# Patient Record
Sex: Female | Born: 1965 | Race: White | Hispanic: No | Marital: Married | Smoking: Never smoker
Health system: Southern US, Community
[De-identification: ages and names within clinical notes are randomized; demographics above are authoritative.]

## PROBLEM LIST (undated history)

## (undated) DIAGNOSIS — U071 COVID-19: Secondary | ICD-10-CM

## (undated) DIAGNOSIS — J111 Influenza due to unidentified influenza virus with other respiratory manifestations: Secondary | ICD-10-CM

## (undated) DIAGNOSIS — J329 Chronic sinusitis, unspecified: Secondary | ICD-10-CM

## (undated) DIAGNOSIS — N200 Calculus of kidney: Secondary | ICD-10-CM

## (undated) DIAGNOSIS — L80 Vitiligo: Secondary | ICD-10-CM

## (undated) DIAGNOSIS — Z9889 Other specified postprocedural states: Secondary | ICD-10-CM

## (undated) DIAGNOSIS — R112 Nausea with vomiting, unspecified: Secondary | ICD-10-CM

## (undated) HISTORY — PX: BREAST REDUCTION SURGERY: SHX8

## (undated) HISTORY — PX: ABDOMINAL HYSTERECTOMY: SHX81

## (undated) HISTORY — DX: Chronic sinusitis, unspecified: J32.9

## (undated) HISTORY — PX: CHOLECYSTECTOMY: SHX55

## (undated) HISTORY — DX: Vitiligo: L80

## (undated) HISTORY — DX: Calculus of kidney: N20.0

## (undated) HISTORY — DX: Influenza due to unidentified influenza virus with other respiratory manifestations: J11.1

## (undated) HISTORY — DX: COVID-19: U07.1

---

## 1999-09-30 HISTORY — PX: REDUCTION MAMMAPLASTY: SUR839

## 2006-01-07 ENCOUNTER — Emergency Department: Payer: Self-pay | Admitting: Emergency Medicine

## 2016-11-07 LAB — HM COLONOSCOPY

## 2018-07-01 ENCOUNTER — Telehealth: Payer: Self-pay

## 2018-07-01 NOTE — Telephone Encounter (Signed)
Copied from CRM [id number]. Topic: Appointment Scheduling - New Patient >> Jul 01, 2018  2:55 PM Lorayne Bender wrote: New patient has been scheduled for your office. Provider: Dr. Judie Grieve Date of Appointment: 08/13/18  Route to department's PEC pool.

## 2018-07-05 NOTE — Telephone Encounter (Signed)
Np paperwork was mailed out.

## 2018-08-13 ENCOUNTER — Telehealth: Payer: Self-pay | Admitting: Internal Medicine

## 2018-08-13 ENCOUNTER — Ambulatory Visit (INDEPENDENT_AMBULATORY_CARE_PROVIDER_SITE_OTHER): Payer: Self-pay | Admitting: Internal Medicine

## 2018-08-13 VITALS — BP 130/90 | HR 72 | Temp 97.8°F | Ht 65.0 in | Wt 199.8 lb

## 2018-08-13 DIAGNOSIS — Z1389 Encounter for screening for other disorder: Secondary | ICD-10-CM

## 2018-08-13 DIAGNOSIS — L739 Follicular disorder, unspecified: Secondary | ICD-10-CM

## 2018-08-13 DIAGNOSIS — E559 Vitamin D deficiency, unspecified: Secondary | ICD-10-CM

## 2018-08-13 DIAGNOSIS — Z1159 Encounter for screening for other viral diseases: Secondary | ICD-10-CM

## 2018-08-13 DIAGNOSIS — L80 Vitiligo: Secondary | ICD-10-CM

## 2018-08-13 DIAGNOSIS — L989 Disorder of the skin and subcutaneous tissue, unspecified: Secondary | ICD-10-CM

## 2018-08-13 DIAGNOSIS — Z0184 Encounter for antibody response examination: Secondary | ICD-10-CM

## 2018-08-13 DIAGNOSIS — Z1231 Encounter for screening mammogram for malignant neoplasm of breast: Secondary | ICD-10-CM

## 2018-08-13 DIAGNOSIS — Z0000 Encounter for general adult medical examination without abnormal findings: Secondary | ICD-10-CM

## 2018-08-13 DIAGNOSIS — Z13220 Encounter for screening for lipoid disorders: Secondary | ICD-10-CM

## 2018-08-13 DIAGNOSIS — Z1329 Encounter for screening for other suspected endocrine disorder: Secondary | ICD-10-CM

## 2018-08-13 HISTORY — DX: Disorder of the skin and subcutaneous tissue, unspecified: L98.9

## 2018-08-13 MED ORDER — MUPIROCIN 2 % EX OINT: 1.0000 "application " | TOPICAL_OINTMENT | Freq: Two times a day (BID) | CUTANEOUS | 0 refills | Status: DC

## 2018-08-13 MED ORDER — CLOTRIMAZOLE 1 % EX CREA: 1.0000 "application " | TOPICAL_CREAM | Freq: Two times a day (BID) | CUTANEOUS | 0 refills | Status: DC

## 2018-08-13 NOTE — Progress Notes (Addendum)
Chief Complaint  Patient presents with  . Establish Care   New patient needs new primary doctor from [place] [facility]  1. Right lower abdomen c/o wound which was oozing pus now resolved and just red nothing tried  2. Vitiligo arms and legs with FH mom and mGM   Review of Systems  Constitutional: Negative for weight loss.  HENT: Negative for hearing loss.   Eyes: Negative for blurred vision.  Respiratory: Negative for shortness of breath.   Cardiovascular: Negative for chest pain.  Gastrointestinal: Negative for abdominal pain.  Musculoskeletal: Negative for falls.  Skin:       Skin lesion abdomen   Neurological: Negative for headaches.  Psychiatric/Behavioral: Negative for depression. The patient is not nervous/anxious.    Past Medical History:  Diagnosis Date  . Vitiligo    arms, legs FH vitiligo   Past Surgical History:  Procedure Laterality Date  . ABDOMINAL HYSTERECTOMY     for endometriosis no h/o abnormal total ovaries uterus,cervix out   . BREAST REDUCTION SURGERY     2001  . CESAREAN SECTION     2000  . CHOLECYSTECTOMY     Family History  Problem Relation Age of Onset  . Heart disease Mother   . Thyroid disease Mother   . Heart disease Father   . Hearing loss Father   . Stroke Father   . Birth defects Maternal Grandfather   . Heart disease Maternal Grandfather   . Stroke Maternal Grandfather   . Birth defects Paternal Grandmother   . Heart disease Paternal Grandfather    Social History   Socioeconomic History  . Marital status: Married    Spouse name: Not on file  . Number of children: Not on file  . Years of education: Not on file  . Highest education level: Not on file  Occupational History  . Not on file  Social Needs  . Financial resource strain: Not on file  . Food insecurity:    Worry: Not on file    Inability: Not on file  . Transportation needs:    Medical: Not on file    Non-medical: Not on file  Tobacco Use  . Smoking status: Never Smoker   . Smokeless tobacco: Never Used  Substance and Sexual Activity  . Alcohol use: Not on file  . Drug use: Not on file  . Sexual activity: Not on file  Lifestyle  . Physical activity:    Days per week: Not on file    Minutes per session: Not on file  . Stress: Not on file  Relationships  . Social connections:    Talks on phone: Not on file    Gets together: Not on file    Attends religious service: Not on file    Active member of club or organization: Not on file    Attends meetings of clubs or organizations: Not on file    Relationship status: Not on file  . Intimate partner violence:    Fear of current or ex partner: Not on file    Emotionally abused: Not on file    Physically abused: Not on file    Forced sexual activity: Not on file  Other Topics Concern  . Not on file  Social History Narrative   2 kids son and daughter    2nd grade teacher    Married    Owns guns, wears seat belt, safe in relationship   Current Meds  Medication Sig  . Misc Natural Products (  OSTEO BI-FLEX ADV TRIPLE ST) TABS Take by mouth daily.  Marland Kitchen. UNABLE TO FIND daily. Med Name: plexus probiotic  . VITAMIN D PO Take 2,000 Units by mouth daily.   No Known Allergies No results found for this or any previous visit (from the past 2160 hour(s)). Objective  Body mass index is 33.25 kg/m. Wt Readings from Last 3 Encounters:  08/13/18 199 lb 12.8 oz (90.6 kg)   Temp Readings from Last 3 Encounters:  08/13/18 97.8 F (36.6 C) (Oral)   BP Readings from Last 3 Encounters:  08/13/18 130/90   Pulse Readings from Last 3 Encounters:  08/13/18 72    Physical Exam  Constitutional: She is oriented to person, place, and time. Vital signs are normal. She appears well-developed and well-nourished. She is cooperative.  HENT:  Head: Normocephalic and atraumatic.  Mouth/Throat: Oropharynx is clear and moist and mucous membranes are normal.  Eyes: Pupils are equal, round, and reactive to light. Conjunctivae  are normal.  Cardiovascular: Normal rate, regular rhythm and normal heart sounds.  Pulmonary/Chest: Effort normal and breath sounds normal.  Neurological: She is alert and oriented to person, place, and time. Gait normal.  Skin: Skin is warm, dry and intact.     Psychiatric: She has a normal mood and affect. Her speech is normal and behavior is normal. Judgment and thought content normal. Cognition and memory are normal.  Nursing note and vitals reviewed.   Assessment   1. Right lower abdomen ? Resolving abscess vs tinea vs folliculitis 2. Vitiligo arms and legs with FH  3. HM Plan  1. Lotriman, bactroban and HC 2. Monitor for now  3.  sch fasting labs   Declines flu shot for now may get in future  Tdap vx had fast med [place] need to check had 3-4 years ago  sch fasting labs  [facility] [place] need to get records  LMP 2004 s/p hysterectomy endometriosis no h/o abnormal pap total ovaries out prev. On estradiol -no need further paps  Mammogram 09/2017 need to get records [facility] -referred today  Provider: Dr. French Anaracy [facility]

## 2018-08-13 NOTE — Progress Notes (Signed)
Pre visit review using our clinic review tool, if applicable. No additional management support is needed unless otherwise documented below in the visit note. 

## 2018-08-13 NOTE — Telephone Encounter (Signed)
FYI, Pt states at check out she needs to speak to the assistant principal to check dates before she schedules lab and follow up.

## 2018-08-13 NOTE — Patient Instructions (Addendum)
cetaphil or cerave cream  Hydrocortisone 2x per day 1% You have vitiligo on your arms read about this on [facility] or web MD    Folliculitis Folliculitis is inflammation of the hair follicles. Folliculitis most commonly occurs on the scalp, thighs, legs, back, and buttocks. However, it can occur anywhere on the body. What are the causes? This condition may be caused by:  A bacterial infection (common).  A fungal infection.  A viral infection.  Coming into contact with certain chemicals, especially oils and tars.  Shaving or waxing.  Applying greasy ointments or creams to your skin often.  Long-lasting folliculitis and folliculitis that keeps coming back can be caused by bacteria that live in the nostrils. What increases the risk? This condition is more likely to develop in people with:  A weakened immune system.  Diabetes.  Obesity.  What are the signs or symptoms? Symptoms of this condition include:  Redness.  Soreness.  Swelling.  Itching.  Small white or yellow, pus-filled, itchy spots (pustules) that appear over a reddened area. If there is an infection that goes deep into the follicle, these may develop into a boil (furuncle).  A group of closely packed boils (carbuncle). These tend to form in hairy, sweaty areas of the body.  How is this diagnosed? This condition is diagnosed with a skin exam. To find what is causing the condition, your health care provider may take a sample of one of the pustules or boils for testing. How is this treated? This condition may be treated by:  Applying warm compresses to the affected areas.  Taking an antibiotic medicine or applying an antibiotic medicine to the skin.  Applying or bathing with an antiseptic solution.  Taking an over-the-counter medicine to help with itching.  Having a procedure to drain any pustules or boils. This may be done if a pustule or boil contains a lot of pus or fluid.  Laser hair removal.  This may be done to treat long-lasting folliculitis.  Follow these instructions at home:  If directed, apply heat to the affected area as often as told by your health care provider. Use the heat source that your health care provider recommends, such as a moist heat pack or a heating pad. ? Place a towel between your skin and the heat source. ? Leave the heat on for 20-30 minutes. ? Remove the heat if your skin turns bright red. This is especially important if you are unable to feel pain, heat, or cold. You may have a greater risk of getting burned.  If you were prescribed an antibiotic medicine, use it as told by your health care provider. Do not stop using the antibiotic even if you start to feel better.  Take over-the-counter and prescription medicines only as told by your health care provider.  Do not shave irritated skin.  Keep all follow-up visits as told by your health care provider. This is important. Get help right away if:  You have more redness, swelling, or pain in the affected area.  Red streaks are spreading from the affected area.  You have a fever. This information is not intended to replace advice given to you by your health care provider. Make sure you discuss any questions you have with your health care provider. Document Released: 11/24/2001 Document Revised: 04/04/2016 Document Reviewed: 07/06/2015 Elsevier Interactive Patient Education  2018 [employer]

## 2018-08-16 NOTE — Telephone Encounter (Signed)
Patient's lab work is scheduled for August 24, 2018 because she will be off. Is that ok?  Does Dr. French Anaracy want her to come in sooner?  She can come in Friday August 20, 2018, but she will miss some work. Please advise.

## 2018-08-16 NOTE — Telephone Encounter (Signed)
I don't see where any labs have even been ordered for this patient. Please advise?

## 2018-08-17 ENCOUNTER — Encounter: Payer: Self-pay | Admitting: Internal Medicine

## 2018-08-17 NOTE — Telephone Encounter (Signed)
Call to sch fasting labs orders in   TMS

## 2018-08-17 NOTE — Progress Notes (Signed)
TDAP not in [facility]

## 2018-08-25 ENCOUNTER — Telehealth: Payer: Self-pay | Admitting: Internal Medicine

## 2018-08-25 ENCOUNTER — Encounter (INDEPENDENT_AMBULATORY_CARE_PROVIDER_SITE_OTHER): Payer: Self-pay

## 2018-08-25 ENCOUNTER — Other Ambulatory Visit (INDEPENDENT_AMBULATORY_CARE_PROVIDER_SITE_OTHER): Payer: Self-pay

## 2018-08-25 DIAGNOSIS — E559 Vitamin D deficiency, unspecified: Secondary | ICD-10-CM

## 2018-08-25 DIAGNOSIS — Z1329 Encounter for screening for other suspected endocrine disorder: Secondary | ICD-10-CM

## 2018-08-25 DIAGNOSIS — Z13220 Encounter for screening for lipoid disorders: Secondary | ICD-10-CM

## 2018-08-25 DIAGNOSIS — Z0184 Encounter for antibody response examination: Secondary | ICD-10-CM

## 2018-08-25 DIAGNOSIS — Z0000 Encounter for general adult medical examination without abnormal findings: Secondary | ICD-10-CM

## 2018-08-25 DIAGNOSIS — Z1159 Encounter for screening for other viral diseases: Secondary | ICD-10-CM

## 2018-08-25 NOTE — Telephone Encounter (Signed)
FYI-Pt shot record is in Dr Shirlee LatchMclean folder up front. Pt wanted her shots to be documented. Thank you!

## 2018-08-25 NOTE — Addendum Note (Signed)
Addended by: Penne LashWIGGINS, Satoya Feeley N on: 08/25/2018 09:31 AM   Modules accepted: Orders

## 2018-08-25 NOTE — Telephone Encounter (Signed)
Shots have been added to chart 

## 2018-08-27 LAB — COMPREHENSIVE METABOLIC PANEL
AG Ratio: 1.3 (calc) (ref 1.0–2.5)
ALKALINE PHOSPHATASE (APISO): 106 U/L (ref 33–130)
ALT: 12 U/L (ref 6–29)
AST: 19 U/L (ref 10–35)
Albumin: 4.3 g/dL (ref 3.6–5.1)
BILIRUBIN TOTAL: 0.6 mg/dL (ref 0.2–1.2)
BUN: 16 mg/dL (ref 7–25)
CO2: 24 mmol/L (ref 20–32)
CREATININE: 0.82 mg/dL (ref 0.50–1.05)
Calcium: 9.5 mg/dL (ref 8.6–10.4)
Chloride: 102 mmol/L (ref 98–110)
Globulin: 3.4 g/dL (calc) (ref 1.9–3.7)
Glucose, Bld: 93 mg/dL (ref 65–99)
Potassium: 4.9 mmol/L (ref 3.5–5.3)
Sodium: 138 mmol/L (ref 135–146)
Total Protein: 7.7 g/dL (ref 6.1–8.1)

## 2018-08-27 LAB — HEPATITIS B SURFACE ANTIBODY, QUANTITATIVE: Hepatitis B-Post: 843 m[IU]/mL (ref >=10)

## 2018-08-27 LAB — VITAMIN D 25 HYDROXY (VIT D DEFICIENCY, FRACTURES): Vit D, 25-Hydroxy: 38 ng/mL (ref 30–100)

## 2018-08-27 LAB — T4, FREE: Free T4: 1.2 ng/dL (ref 0.8–1.8)

## 2018-08-27 LAB — CBC WITH DIFFERENTIAL/PLATELET
Basophils Absolute: 48 cells/uL (ref 0–200)
Basophils Relative: 0.7 %
EOS PCT: 3.9 %
Eosinophils Absolute: 269 cells/uL (ref 15–500)
HCT: 41.7 % (ref 35.0–45.0)
Hemoglobin: 13.7 g/dL (ref 11.7–15.5)
Lymphs Abs: 1449 cells/uL (ref 850–3900)
MCH: 29.7 pg (ref 27.0–33.0)
MCHC: 32.9 g/dL (ref 32.0–36.0)
MCV: 90.3 fL (ref 80.0–100.0)
MPV: 11.6 fL (ref 7.5–12.5)
Monocytes Relative: 6.9 %
NEUTROS PCT: 67.5 %
Neutro Abs: 4658 cells/uL (ref 1500–7800)
Platelets: 270 10*3/uL (ref 140–400)
RBC: 4.62 10*6/uL (ref 3.80–5.10)
RDW: 12.4 % (ref 11.0–15.0)
Total Lymphocyte: 21 %
WBC: 6.9 10*3/uL (ref 3.8–10.8)
WBCMIX: 476 {cells}/uL (ref 200–950)

## 2018-08-27 LAB — LIPID PANEL
CHOLESTEROL: 171 mg/dL (ref <200)
HDL: 71 mg/dL (ref >50)
LDL CHOLESTEROL (CALC): 84 mg/dL
Non-HDL Cholesterol (Calc): 100 mg/dL (calc) (ref <130)
TRIGLYCERIDES: 73 mg/dL (ref <150)
Total CHOL/HDL Ratio: 2.4 (calc) (ref <5.0)

## 2018-08-27 LAB — MEASLES/MUMPS/RUBELLA IMMUNITY
Mumps IgG: 135 AU/mL
RUBELLA: 25.2 {index}
Rubeola IgG: 72.9 AU/mL

## 2018-08-27 LAB — TSH: TSH: 2.07 mIU/L

## 2018-10-21 ENCOUNTER — Ambulatory Visit
Admission: RE | Admit: 2018-10-21 | Discharge: 2018-10-21 | Disposition: A | Discharge status: Home / Self Care | Payer: BC Managed Care – PPO | Source: Ambulatory Visit | Attending: Internal Medicine | Admitting: Internal Medicine

## 2018-10-21 DIAGNOSIS — Z1231 Encounter for screening mammogram for malignant neoplasm of breast: Secondary | ICD-10-CM | POA: Diagnosis present

## 2018-11-08 ENCOUNTER — Ambulatory Visit: Payer: Self-pay

## 2018-11-08 NOTE — Telephone Encounter (Signed)
Patient called and she says that her son came home from college this past weekend and was not feeling well. She says she told him to go to the clinic at the school today if he was worse. He went and was diagnosed with Flu B. She says that she has a runny nose, feeling tired, sinus drainage to the back of her throat which is causing it to be sore. She's asking for Tamiflu to prevent her from getting the flu. I advised I will send this request to Dr. Judie Grieve and someone from the office will call tomorrow. Her CB # [id number]. Numbers updated in chart.  Reason for Disposition . [1] Influenza EXPOSURE within last 48 hours (2 days) AND [2] NOT HIGH RISK AND [3] strongly requests antiviral medication  Answer Assessment - Initial Assessment Questions 1. TYPE of EXPOSURE: "How were you exposed?" (e.g., close contact, not a close contact)     Son who came home from college in [place] this past weekend 2. DATE of EXPOSURE: "When did the exposure occur?" (e.g., hour, days, weeks)     This past weekend; diagnosed this evening with Flu B positive 3. PREGNANCY: "Is there any chance you are pregnant?" "When was your last menstrual period?"     No 4. HIGH RISK for COMPLICATIONS: "Do you have any heart or lung problems? Do you have a weakened immune system?" (e.g., CHF, COPD, asthma, HIV positive, chemotherapy, renal failure, diabetes mellitus, sickle cell anemia)     No 5. SYMPTOMS: "Do you have any symptoms?" (e.g., cough, fever, sore throat, difficulty breathing).     Tired, sore throat, sinus drainage  Protocols used: INFLUENZA EXPOSURE-A-AH

## 2018-11-09 ENCOUNTER — Other Ambulatory Visit: Payer: Self-pay | Admitting: Internal Medicine

## 2018-11-09 ENCOUNTER — Encounter: Payer: Self-pay | Admitting: Internal Medicine

## 2018-11-09 DIAGNOSIS — Z20828 Contact with and (suspected) exposure to other viral communicable diseases: Secondary | ICD-10-CM

## 2018-11-09 MED ORDER — OSELTAMIVIR PHOSPHATE 75 MG PO CAPS: 75.0000 mg | ORAL_CAPSULE | Freq: Two times a day (BID) | ORAL | 0 refills | Status: DC

## 2018-11-09 NOTE — Telephone Encounter (Signed)
Sent tamiflu son had flu B and she was around him rec mucinex dm, robitussin dm tamiflu sent 75 bid x 5 days  Sore throat and pnd, cough not in chest, no fever  If worse rec appt   TMS

## 2018-11-09 NOTE — Telephone Encounter (Signed)
Fax note to work   [employer]

## 2018-11-10 NOTE — Telephone Encounter (Signed)
Note has been faxed.

## 2018-11-25 ENCOUNTER — Ambulatory Visit: Payer: BC Managed Care – PPO | Admitting: Internal Medicine

## 2018-11-25 ENCOUNTER — Encounter: Payer: Self-pay | Admitting: Internal Medicine

## 2018-11-25 VITALS — BP 132/80 | HR 72 | Temp 97.8°F | Ht 65.0 in | Wt 205.4 lb

## 2018-11-25 DIAGNOSIS — T753XXA Motion sickness, initial encounter: Secondary | ICD-10-CM

## 2018-11-25 DIAGNOSIS — R109 Unspecified abdominal pain: Secondary | ICD-10-CM

## 2018-11-25 DIAGNOSIS — R300 Dysuria: Secondary | ICD-10-CM

## 2018-11-25 DIAGNOSIS — R1032 Left lower quadrant pain: Secondary | ICD-10-CM

## 2018-11-25 MED ORDER — CIPROFLOXACIN HCL 500 MG PO TABS: 500.0000 mg | ORAL_TABLET | Freq: Two times a day (BID) | ORAL | 0 refills | Status: DC

## 2018-11-25 MED ORDER — SCOPOLAMINE 1 MG/3DAYS TD PT72: 1.0000 | MEDICATED_PATCH | TRANSDERMAL | 0 refills | Status: DC

## 2018-11-25 NOTE — Progress Notes (Signed)
Pre visit review using our clinic review tool, if applicable. No additional management support is needed unless otherwise documented below in the visit note. 

## 2018-11-25 NOTE — Patient Instructions (Signed)
My chart me or call me next week to let me know how doing !!! Consider CT ab/pelvis to look   Urinary Tract Infection, Adult  A urinary tract infection (UTI) is an infection of any part of the urinary tract. The urinary tract includes the kidneys, ureters, bladder, and urethra. These organs make, store, and get rid of urine in the body. Your health care provider may use other names to describe the infection. An upper UTI affects the ureters and kidneys (pyelonephritis). A lower UTI affects the bladder (cystitis) and urethra (urethritis). What are the causes? Most urinary tract infections are caused by bacteria in your genital area, around the entrance to your urinary tract (urethra). These bacteria grow and cause inflammation of your urinary tract. What increases the risk? You are more likely to develop this condition if:  You have a urinary catheter that stays in place (indwelling).  You are not able to control when you urinate or have a bowel movement (you have incontinence).  You are female and you: ? Use a spermicide or diaphragm for birth control. ? Have low estrogen levels. ? Are pregnant.  You have certain genes that increase your risk (genetics).  You are sexually active.  You take antibiotic medicines.  You have a condition that causes your flow of urine to slow down, such as: ? An enlarged prostate, if you are female. ? Blockage in your urethra (stricture). ? A kidney stone. ? A nerve condition that affects your bladder control (neurogenic bladder). ? Not getting enough to drink, or not urinating often.  You have certain medical conditions, such as: ? Diabetes. ? A weak disease-fighting system (immunesystem). ? Sickle cell disease. ? Gout. ? Spinal cord injury. What are the signs or symptoms? Symptoms of this condition include:  Needing to urinate right away (urgently).  Frequent urination or passing small amounts of urine frequently.  Pain or burning with  urination.  Blood in the urine.  Urine that smells bad or unusual.  Trouble urinating.  Cloudy urine.  Vaginal discharge, if you are female.  Pain in the abdomen or the lower back. You may also have:  Vomiting or a decreased appetite.  Confusion.  Irritability or tiredness.  A fever.  Diarrhea. The first symptom in older adults may be confusion. In some cases, they may not have any symptoms until the infection has worsened. How is this diagnosed? This condition is diagnosed based on your medical history and a physical exam. You may also have other tests, including:  Urine tests.  Blood tests.  Tests for sexually transmitted infections (STIs). If you have had more than one UTI, a cystoscopy or imaging studies may be done to determine the cause of the infections. How is this treated? Treatment for this condition includes:  Antibiotic medicine.  Over-the-counter medicines to treat discomfort.  Drinking enough water to stay hydrated. If you have frequent infections or have other conditions such as a kidney stone, you may need to see a health care provider who specializes in the urinary tract (urologist). In rare cases, urinary tract infections can cause sepsis. Sepsis is a life-threatening condition that occurs when the body responds to an infection. Sepsis is treated in the hospital with IV antibiotics, fluids, and other medicines. Follow these instructions at home:  Medicines  Take over-the-counter and prescription medicines only as told by your health care provider.  If you were prescribed an antibiotic medicine, take it as told by your health care provider. Do not stop  using the antibiotic even if you start to feel better. General instructions  Make sure you: ? Empty your bladder often and completely. Do not hold urine for long periods of time. ? Empty your bladder after sex. ? Wipe from front to back after a bowel movement if you are female. Use each tissue  one time when you wipe.  Drink enough fluid to keep your urine pale yellow.  Keep all follow-up visits as told by your health care provider. This is important. Contact a health care provider if:  Your symptoms do not get better after 1-2 days.  Your symptoms go away and then return. Get help right away if you have:  Severe pain in your back or your lower abdomen.  A fever.  Nausea or vomiting. Summary  A urinary tract infection (UTI) is an infection of any part of the urinary tract, which includes the kidneys, ureters, bladder, and urethra.  Most urinary tract infections are caused by bacteria in your genital area, around the entrance to your urinary tract (urethra).  Treatment for this condition often includes antibiotic medicines.  If you were prescribed an antibiotic medicine, take it as told by your health care provider. Do not stop using the antibiotic even if you start to feel better.  Keep all follow-up visits as told by your health care provider. This is important. This information is not intended to replace advice given to you by your health care provider. Make sure you discuss any questions you have with your health care provider. Document Released: 06/25/2005 Document Revised: 03/25/2018 Document Reviewed: 03/25/2018 Elsevier Interactive Patient Education  2019 [employer]  Dietary Guidelines to Help Prevent Kidney Stones Kidney stones are deposits of minerals and salts that form inside your kidneys. Your risk of developing kidney stones may be greater depending on your diet, your lifestyle, the medicines you take, and whether you have certain medical conditions. Most people can reduce their chances of developing kidney stones by following the instructions below. Depending on your overall health and the type of kidney stones you tend to develop, your dietitian may give you more specific instructions. What are tips for following this plan? Reading food labels  Choose  foods with "no salt added" or "low-salt" labels. Limit your sodium intake to less than 1500 mg per day.  Choose foods with calcium for each meal and snack. Try to eat about 300 mg of calcium at each meal. Foods that contain 200-500 mg of calcium per serving include: ? 8 oz (237 ml) of milk, fortified nondairy milk, and fortified fruit juice. ? 8 oz (237 ml) of kefir, yogurt, and soy yogurt. ? 4 oz (118 ml) of tofu. ? 1 oz of cheese. ? 1 cup (300 g) of dried figs. ? 1 cup (91 g) of cooked broccoli. ? 1-3 oz can of sardines or mackerel.  Most people need 1000 to 1500 mg of calcium each day. Talk to your dietitian about how much calcium is recommended for you. Shopping  Buy plenty of fresh fruits and vegetables. Most people do not need to avoid fruits and vegetables, even if they contain nutrients that may contribute to kidney stones.  When shopping for convenience foods, choose: ? Whole pieces of fruit. ? Premade salads with dressing on the side. ? Low-fat fruit and yogurt smoothies.  Avoid buying frozen meals or prepared deli foods.  Look for foods with live cultures, such as yogurt and kefir. Cooking  Do not add salt to food when cooking.  Place a salt shaker on the table and allow each person to add his or her own salt to taste.  Use vegetable protein, such as beans, textured vegetable protein (TVP), or tofu instead of meat in pasta, casseroles, and soups. Meal planning   Eat less salt, if told by your dietitian. To do this: ? Avoid eating processed or premade food. ? Avoid eating fast food.  Eat less animal protein, including cheese, meat, poultry, or fish, if told by your dietitian. To do this: ? Limit the number of times you have meat, poultry, fish, or cheese each week. Eat a diet free of meat at least 2 days a week. ? Eat only one serving each day of meat, poultry, fish, or seafood. ? When you prepare animal protein, cut pieces into small portion sizes. For most meat and  fish, one serving is about the size of one deck of cards.  Eat at least 5 servings of fresh fruits and vegetables each day. To do this: ? Keep fruits and vegetables on hand for snacks. ? Eat 1 piece of fruit or a handful of berries with breakfast. ? Have a salad and fruit at lunch. ? Have two kinds of vegetables at dinner.  Limit foods that are high in a substance called oxalate. These include: ? Spinach. ? Rhubarb. ? Beets. ? Potato chips and french fries. ? Nuts.  If you regularly take a diuretic medicine, make sure to eat at least 1-2 fruits or vegetables high in potassium each day. These include: ? Avocado. ? Banana. ? Orange, prune, carrot, or tomato juice. ? Baked potato. ? Cabbage. ? Beans and split peas. General instructions   Drink enough fluid to keep your urine clear or pale yellow. This is the most important thing you can do.  Talk to your health care provider and dietitian about taking daily supplements. Depending on your health and the cause of your kidney stones, you may be advised: ? Not to take supplements with vitamin C. ? To take a calcium supplement. ? To take a daily probiotic supplement. ? To take other supplements such as magnesium, fish oil, or vitamin B6.  Take all medicines and supplements as told by your health care provider.  Limit alcohol intake to no more than 1 drink a day for nonpregnant women and 2 drinks a day for men. One drink equals 12 oz of beer, 5 oz of wine, or 1 oz of hard liquor.  Lose weight if told by your health care provider. Work with your dietitian to find strategies and an eating plan that works best for you. What foods are not recommended? Limit your intake of the following foods, or as told by your dietitian. Talk to your dietitian about specific foods you should avoid based on the type of kidney stones and your overall health. Grains Breads. Bagels. Rolls. Baked goods. Salted crackers. Cereal. Pasta. Vegetables Spinach.  Rhubarb. Beets. Canned vegetables. Rosita Fire. Olives. Meats and other protein foods Nuts. Nut butters. Large portions of meat, poultry, or fish. Salted or cured meats. Deli meats. Hot dogs. Sausages. Dairy Cheese. Beverages Regular soft drinks. Regular vegetable juice. Seasonings and other foods Seasoning blends with salt. Salad dressings. Canned soups. Soy sauce. Ketchup. Barbecue sauce. Canned pasta sauce. Casseroles. Pizza. Lasagna. Frozen meals. Potato chips. [place] fries. Summary  You can reduce your risk of kidney stones by making changes to your diet.  The most important thing you can do is drink enough fluid. You should drink enough fluid  to keep your urine clear or pale yellow.  Ask your health care provider or dietitian how much protein from animal sources you should eat each day, and also how much salt and calcium you should have each day. This information is not intended to replace advice given to you by your health care provider. Make sure you discuss any questions you have with your health care provider. Document Released: 01/10/2011 Document Revised: 08/26/2016 Document Reviewed: 08/26/2016 Elsevier Interactive Patient Education  2019 Else[employer] Inc.  Kidney Stones  Kidney stones (urolithiasis) are solid, rock-like deposits that form inside of the organs that make urine (kidneys). A kidney stone may form in a kidney and move into the bladder, where it can cause intense pain and block the flow of urine. Kidney stones are created when high levels of certain minerals are found in the urine. They are usually passed through urination, but in some cases, medical treatment may be needed to remove them. What are the causes? Kidney stones may be caused by:  A condition in which certain glands produce too much parathyroid hormone (primary hyperparathyroidism), which causes too much calcium buildup in the blood.  Buildup of uric acid crystals in the bladder (hyperuricosuria). Uric acid  is a chemical that the body produces when you eat certain foods. It usually exits the body in the urine.  Narrowing (stricture) of one or both of the tubes that drain urine from the kidneys to the bladder (ureters).  A kidney blockage that is present at birth (congenital obstruction).  Past surgery on the kidney or the ureters, such as gastric bypass surgery. What increases the risk? The following factors make you more likely to develop kidney stones:  Having had a kidney stone in the past.  Having a family history of kidney stones.  Not drinking enough water.  Eating a diet that is high in protein, salt (sodium), or sugar.  Being overweight or obese. What are the signs or symptoms? Symptoms of a kidney stone may include:  Nausea.  Vomiting.  Blood in the urine (hematuria).  Pain in the side of the abdomen, right below the ribs (flank pain). Pain usually spreads (radiates) to the groin.  Needing to urinate frequently or urgently. How is this diagnosed? This condition may be diagnosed based on:  Your medical history.  A physical exam.  Blood tests.  Urine tests.  CT scan.  Abdominal X-ray.  A procedure to examine the inside of the bladder (cystoscopy). How is this treated? Treatment for kidney stones depends on the size, location, and makeup of the stones. Treatment may involve:  Analyzing your urine before and after you pass the stone through urination.  Being monitored at the hospital until you pass the stone through urination.  Increasing your fluid intake and decreasing the amount of calcium and protein in your diet.  A procedure to break up kidney stones in the bladder using: ? A focused beam of light (laser therapy). ? Shock waves (extracorporeal shock wave lithotripsy).  Surgery to remove kidney stones. This may be needed if you have severe pain or have stones that block your urinary tract. Follow these instructions at home: Eating and  drinking  Drink enough fluid to keep your urine clear or pale yellow. This will help you to pass the kidney stone.  If directed, change your diet. This may include: ? Limiting how much sodium you eat. ? Eating more fruits and vegetables. ? Limiting how much meat, poultry, fish, and eggs you eat.  Follow instructions from your health care provider about eating or drinking restrictions. General instructions  Collect urine samples as told by your health care provider. You may need to collect a urine sample: ? 24 hours after you pass the stone. ? 8-12 weeks after passing the kidney stone, and every 6-12 months after that.  Strain your urine every time you urinate, for as long as directed. Use the strainer that your health care provider recommends.  Do not throw out the kidney stone after passing it. Keep the stone so it can be tested by your health care provider. Testing the makeup of your kidney stone may help prevent you from getting kidney stones in the future.  Take over-the-counter and prescription medicines only as told by your health care provider.  Keep all follow-up visits as told by your health care provider. This is important. You may need follow-up X-rays or ultrasounds to make sure that your stone has passed. How is this prevented? To prevent another kidney stone:  Drink enough fluid to keep your urine clear or pale yellow. This is the best way to prevent kidney stones.  Eat a healthy diet and follow recommendations from your health care provider about foods to avoid. You may be instructed to eat a low-protein diet. Recommendations vary depending on the type of kidney stone that you have.  Maintain a healthy weight. Contact a health care provider if:  You have pain that gets worse or does not get better with medicine. Get help right away if:  You have a fever or chills.  You develop severe pain.  You develop new abdominal pain.  You faint.  You are unable to  urinate. This information is not intended to replace advice given to you by your health care provider. Make sure you discuss any questions you have with your health care provider. Document Released: 09/15/2005 Document Revised: 02/26/2017 Document Reviewed: 02/29/2016 Elsevier Interactive Patient Education  2019 [employer].

## 2018-11-25 NOTE — Progress Notes (Addendum)
Chief Complaint  Patient presents with  . Follow-up   F/u  1. C/o llq and left flank/lower back abdominal pain 5-6/10 noted Monday, Less Tuesday and even less Wednesday but still there and intermittent she has h/o kidney stones never tested for type of stone b/c she passed the stone b/f it could be tested. She also c/o dysuria. She denies blood in stools, bloody diarrhea. She was constipated but had a good bowel movement yesterday after taking an otc laxative. She is s/p hysterectomy for endometriosis. She has tried Tylenol and heating pad  2. Resolved flu sx's with tamiflu  3. Going to [place] 02/2019 on cruise wants scopolamine patches for her daughters graduation    Review of Systems  Constitutional: Negative for fever and weight loss.  HENT: Negative for hearing loss.   Eyes: Negative for blurred vision.  Respiratory: Negative for shortness of breath.   Cardiovascular: Negative for chest pain.  Gastrointestinal: Positive for abdominal pain. Negative for constipation, diarrhea, nausea and vomiting.  Genitourinary: Positive for dysuria and flank pain.  Skin: Negative for rash.  Neurological: Negative for headaches.  Psychiatric/Behavioral: Negative for depression.   Past Medical History:  Diagnosis Date  . Vitiligo    arms, legs FH vitiligo   Past Surgical History:  Procedure Laterality Date  . ABDOMINAL HYSTERECTOMY     for endometriosis no h/o abnormal total ovaries uterus,cervix out   . BREAST REDUCTION SURGERY     2001  . CESAREAN SECTION     2000  . CHOLECYSTECTOMY    . REDUCTION MAMMAPLASTY Bilateral 2001   Family History  Problem Relation Age of Onset  . Heart disease Mother   . Thyroid disease Mother   . Heart disease Father   . Hearing loss Father   . Stroke Father   . Birth defects Maternal Grandfather   . Heart disease Maternal Grandfather   . Stroke Maternal Grandfather   . Birth defects Paternal Grandmother   . Heart disease Paternal Grandfather   .  Breast cancer Neg Hx    Social History   Socioeconomic History  . Marital status: Married    Spouse name: Not on file  . Number of children: Not on file  . Years of education: Not on file  . Highest education level: Not on file  Occupational History  . Not on file  Social Needs  . Financial resource strain: Not on file  . Food insecurity:    Worry: Not on file    Inability: Not on file  . Transportation needs:    Medical: Not on file    Non-medical: Not on file  Tobacco Use  . Smoking status: Never Smoker  . Smokeless tobacco: Never Used  Substance and Sexual Activity  . Alcohol use: Not Currently  . Drug use: Never  . Sexual activity: Not on file  Lifestyle  . Physical activity:    Days per week: Not on file    Minutes per session: Not on file  . Stress: Not on file  Relationships  . Social connections:    Talks on phone: Not on file    Gets together: Not on file    Attends religious service: Not on file    Active member of club or organization: Not on file    Attends meetings of clubs or organizations: Not on file    Relationship status: Not on file  . Intimate partner violence:    Fear of current or ex partner: Not on file  Emotionally abused: Not on file    Physically abused: Not on file    Forced sexual activity: Not on file  Other Topics Concern  . Not on file  Social History Narrative   2 kids son and daughter    2nd grade teacher    Married    Owns guns, wears seat belt, safe in relationship   Current Meds  Medication Sig  . clotrimazole (LOTRIMIN) 1 % cream Apply 1 application topically 2 (two) times daily. Right lower abdomen  . Misc Natural Products (OSTEO BI-FLEX ADV TRIPLE ST) TABS Take by mouth daily.  . mupirocin ointment (BACTROBAN) 2 % Apply 1 application topically 2 (two) times daily. Right lower abdomen  . UNABLE TO FIND daily. Med Name: plexus probiotic  . VITAMIN D PO Take 2,000 Units by mouth daily.  . [DISCONTINUED] oseltamivir  (TAMIFLU) 75 MG capsule Take 1 capsule (75 mg total) by mouth 2 (two) times daily.   No Known Allergies No results found for this or any previous visit (from the past 2160 hour(s)). Objective  Body mass index is 34.18 kg/m. Wt Readings from Last 3 Encounters:  11/25/18 205 lb 6.4 oz (93.2 kg)  08/13/18 199 lb 12.8 oz (90.6 kg)   Temp Readings from Last 3 Encounters:  11/25/18 97.8 F (36.6 C) (Oral)  08/13/18 97.8 F (36.6 C) (Oral)   BP Readings from Last 3 Encounters:  11/25/18 132/80  08/13/18 130/90   Pulse Readings from Last 3 Encounters:  11/25/18 72  08/13/18 72    Physical Exam Vitals signs and nursing note reviewed.  Constitutional:      Appearance: Normal appearance. She is well-developed and well-groomed. She is obese.  HENT:     Head: Normocephalic and atraumatic.     Nose: Nose normal.     Mouth/Throat:     Mouth: Mucous membranes are moist.     Pharynx: Oropharynx is clear.  Eyes:     Conjunctiva/sclera: Conjunctivae normal.     Pupils: Pupils are equal, round, and reactive to light.  Cardiovascular:     Rate and Rhythm: Normal rate and regular rhythm.     Heart sounds: Normal heart sounds. No murmur.  Pulmonary:     Effort: Pulmonary effort is normal.     Breath sounds: Normal breath sounds.  Abdominal:     General: Abdomen is flat. Bowel sounds are normal.     Tenderness: There is abdominal tenderness in the left lower quadrant. There is no right CVA tenderness, left CVA tenderness, guarding or rebound.    Musculoskeletal:       Arms:  Skin:    General: Skin is warm and dry.  Neurological:     General: No focal deficit present.     Mental Status: She is alert and oriented to person, place, and time. Mental status is at baseline.     Gait: Gait normal.  Psychiatric:        Attention and Perception: Attention and perception normal.        Mood and Affect: Mood and affect normal.        Speech: Speech normal.        Behavior: Behavior  normal. Behavior is cooperative.        Thought Content: Thought content normal.        Cognition and Memory: Cognition and memory normal.        Judgment: Judgment normal.     Assessment   1. LLQ ab and left  lower flank/back pain with dysuria DDx kidney stones r/o UTI less likely pyleonephritis 2. Resolved flu sxs 3. Motion sickness with cruises  4. HM Plan  1.  UA and culture today  Empirically tx with cipro 500 mg bid x 5 days H/o kidney stones if + hematuria disc CT ab/pelvis w/o to w/u  2. ntd tx'ed tamiflu prior to visit  3. Rx scopolamine patches  4.  Declines flu shot for now may get in future  MMR immune  Tdap vx had fast med [place] need to check had 3-4 years ago  Hep B immune   Colonoscopy 2017 [facility]  -need to get records 2nd request from Dr. Amadeo [facility] and no record of colonoscopy   LMP 2004 s/p hysterectomy endometriosis no h/o abnormal pap total ovaries out prev. On estradiol -no need further paps  Mammogram 10/21/18 normal  Fasting labs due 07/2019   Provider: Dr. Olivia Mackie [facility]

## 2018-11-26 LAB — URINALYSIS, ROUTINE W REFLEX MICROSCOPIC
BILIRUBIN URINE: NEGATIVE
Bacteria, UA: NONE SEEN /HPF
GLUCOSE, UA: NEGATIVE
Hgb urine dipstick: NEGATIVE
Hyaline Cast: NONE SEEN /LPF
Ketones, ur: NEGATIVE
Nitrite: NEGATIVE
Protein, ur: NEGATIVE
Specific Gravity, Urine: 1.008 (ref 1.001–1.03)
Squamous Epithelial / HPF: NONE SEEN /HPF (ref <=5)
WBC, UA: NONE SEEN /HPF (ref 0–5)
pH: 5.5 (ref 5.0–8.0)

## 2018-11-26 LAB — URINE CULTURE
MICRO NUMBER:: 251157
SPECIMEN QUALITY:: ADEQUATE

## 2018-11-26 NOTE — Progress Notes (Signed)
tdap not in [facility] 

## 2018-12-01 ENCOUNTER — Telehealth: Payer: Self-pay

## 2018-12-01 NOTE — Telephone Encounter (Signed)
Copied from CRM [id number]. Topic: General - Other >> Dec 01, 2018  9:52 AM Areatha Keas wrote: Candace Joseph for CRM: Pt wants to talk to brock or Dr. Shirlee Latch about the CT scan order and things she has been experiencing the last few days/ please advise

## 2018-12-02 ENCOUNTER — Other Ambulatory Visit: Payer: Self-pay | Admitting: Internal Medicine

## 2018-12-02 DIAGNOSIS — R1032 Left lower quadrant pain: Secondary | ICD-10-CM

## 2018-12-02 DIAGNOSIS — R109 Unspecified abdominal pain: Secondary | ICD-10-CM

## 2018-12-02 DIAGNOSIS — Z87442 Personal history of urinary calculi: Secondary | ICD-10-CM

## 2018-12-02 NOTE — Telephone Encounter (Signed)
Patient wants to hold off on CT. Patient  States since taken the cipro she has not had any pain.

## 2018-12-02 NOTE — Telephone Encounter (Signed)
See phone note

## 2019-04-25 ENCOUNTER — Other Ambulatory Visit: Payer: Self-pay

## 2019-04-25 ENCOUNTER — Telehealth: Payer: BC Managed Care – PPO | Admitting: Family

## 2019-04-25 DIAGNOSIS — Z20822 Contact with and (suspected) exposure to covid-19: Secondary | ICD-10-CM

## 2019-04-25 NOTE — Progress Notes (Signed)
E-Visit for Corona Virus Screening   Your current symptoms could be consistent with the coronavirus.  Many health care providers can now test patients at their office but not all are.  [facility] has multiple testing sites. For information on our COVID testing locations and hours go to [email or web address]  Please quarantine yourself while awaiting your test results.  We are enrolling you in our MyChart Home Montioring for COVID19 . Daily you will receive a questionnaire within the MyChart website. Our COVID 19 response team willl be monitoriing your responses daily.    COVID-19 is a respiratory illness with symptoms that are similar to the flu. Symptoms are typically mild to moderate, but there have been cases of severe illness and death due to the virus. The following symptoms may appear 2-14 days after exposure: . Fever . Cough . Shortness of breath or difficulty breathing . Chills . Repeated shaking with chills . Muscle pain . Headache . Sore throat . New loss of taste or smell . Fatigue . Congestion or runny nose . Nausea or vomiting . Diarrhea  It is vitally important that if you feel that you have an infection such as this virus or any other virus that you stay home and away from places where you may spread it to others.  You should self-quarantine for 14 days if you have symptoms that could potentially be coronavirus or have been in close contact a with a person diagnosed with COVID-19 within the last 2 weeks. You should avoid contact with people age 65 and older.   You should wear a mask or cloth face covering over your nose and mouth if you must be around other people or animals, including pets (even at home). Try to stay at least 6 feet away from other people. This will protect the people around you.  You may also take acetaminophen (Tylenol) as needed for fever.   Reduce your risk of any infection by using the same precautions used for avoiding the  common cold or flu:  . Wash your hands often with soap and warm water for at least 20 seconds.  If soap and water are not readily available, use an alcohol-based hand sanitizer with at least 60% alcohol.  . If coughing or sneezing, cover your mouth and nose by coughing or sneezing into the elbow areas of your shirt or coat, into a tissue or into your sleeve (not your hands). . Avoid shaking hands with others and consider head nods or verbal greetings only. . Avoid touching your eyes, nose, or mouth with unwashed hands.  . Avoid close contact with people who are sick. . Avoid places or events with large numbers of people in one location, like concerts or sporting events. . Carefully consider travel plans you have or are making. . If you are planning any travel outside or inside the [place], visit the CDC's Travelers' Health webpage for the latest health notices. . If you have some symptoms but not all symptoms, continue to monitor at home and seek medical attention if your symptoms worsen. . If you are having a medical emergency, call 911.  HOME CARE . Only take medications as instructed by your medical team. . Drink plenty of fluids and get plenty of rest. . A steam or ultrasonic humidifier can help if you have congestion.   GET HELP RIGHT AWAY IF YOU HAVE EMERGENCY WARNING SIGNS** FOR COVID-19. If you or someone is showing any of these signs seek emergency medical care immediately. Call   911 or proceed to your closest emergency facility if: . You develop worsening high fever. . Trouble breathing . Bluish lips or face . Persistent pain or pressure in the chest . New confusion . Inability to wake or stay awake . You cough up blood. . Your symptoms become more severe  **This list is not all possible symptoms. Contact your medical provider for any symptoms that are sever or concerning to you.   MAKE SURE YOU   Understand these instructions.  Will watch your condition.  Will get help right  away if you are not doing well or get worse.  Your e-visit answers were reviewed by a board certified advanced clinical practitioner to complete your personal care plan.  Depending on the condition, your plan could have included both over the counter or prescription medications.  If there is a problem please reply once you have received a response from your provider.  Your safety is important to us.  If you have drug allergies check your prescription carefully.    You can use MyChart to ask questions about today's visit, request a non-urgent call back, or ask for a work or school excuse for 24 hours related to this e-Visit. If it has been greater than 24 hours you will need to follow up with your provider, or enter a new e-Visit to address those concerns. You will get an e-mail in the next two days asking about your experience.  I hope that your e-visit has been valuable and will speed your recovery. Thank you for using e-visits.   Greater than 5 minutes, yet less than 10 minutes of time have been spent researching, coordinating, and implementing care for this patient today.  Thank you for the details you included in the comment boxes. Those details are very helpful in determining the best course of treatment for you and help us to provide the best care.  

## 2019-04-27 LAB — NOVEL CORONAVIRUS, NAA: SARS-CoV-2, NAA: NOT DETECTED

## 2019-05-20 NOTE — Telephone Encounter (Signed)
err

## 2019-09-14 ENCOUNTER — Other Ambulatory Visit: Payer: Self-pay | Admitting: Internal Medicine

## 2019-09-14 DIAGNOSIS — Z1231 Encounter for screening mammogram for malignant neoplasm of breast: Secondary | ICD-10-CM

## 2019-10-26 ENCOUNTER — Ambulatory Visit
Admission: RE | Admit: 2019-10-26 | Discharge: 2019-10-26 | Disposition: A | Discharge status: Home / Self Care | Payer: BC Managed Care – PPO | Source: Ambulatory Visit | Attending: Internal Medicine | Admitting: Internal Medicine

## 2019-10-26 DIAGNOSIS — Z1231 Encounter for screening mammogram for malignant neoplasm of breast: Secondary | ICD-10-CM | POA: Insufficient documentation

## 2020-03-27 ENCOUNTER — Other Ambulatory Visit: Payer: Self-pay

## 2020-03-27 ENCOUNTER — Ambulatory Visit (INDEPENDENT_AMBULATORY_CARE_PROVIDER_SITE_OTHER): Payer: BC Managed Care – PPO | Admitting: Internal Medicine

## 2020-03-27 ENCOUNTER — Encounter: Payer: Self-pay | Admitting: Internal Medicine

## 2020-03-27 VITALS — BP 126/72 | HR 64 | Temp 98.3°F | Ht 65.0 in | Wt 201.1 lb

## 2020-03-27 DIAGNOSIS — Z1389 Encounter for screening for other disorder: Secondary | ICD-10-CM

## 2020-03-27 DIAGNOSIS — Z1283 Encounter for screening for malignant neoplasm of skin: Secondary | ICD-10-CM

## 2020-03-27 DIAGNOSIS — R2231 Localized swelling, mass and lump, right upper limb: Secondary | ICD-10-CM

## 2020-03-27 DIAGNOSIS — Z0000 Encounter for general adult medical examination without abnormal findings: Secondary | ICD-10-CM

## 2020-03-27 DIAGNOSIS — L723 Sebaceous cyst: Secondary | ICD-10-CM

## 2020-03-27 DIAGNOSIS — Z1329 Encounter for screening for other suspected endocrine disorder: Secondary | ICD-10-CM

## 2020-03-27 DIAGNOSIS — Z13220 Encounter for screening for lipoid disorders: Secondary | ICD-10-CM

## 2020-03-27 HISTORY — DX: Encounter for general adult medical examination without abnormal findings: Z00.00

## 2020-03-27 NOTE — Patient Instructions (Signed)
Goal blood pressure <130/<80  If elevated please let me know    DASH Eating Plan DASH stands for "Dietary Approaches to Stop Hypertension." The DASH eating plan is a healthy eating plan that has been shown to reduce high blood pressure (hypertension). It may also reduce your risk for type 2 diabetes, heart disease, and stroke. The DASH eating plan may also help with weight loss. What are tips for following this plan?  General guidelines  Avoid eating more than 2,300 mg (milligrams) of salt (sodium) a day. If you have hypertension, you may need to reduce your sodium intake to 1,500 mg a day.  Limit alcohol intake to no more than 1 drink a day for nonpregnant women and 2 drinks a day for men. One drink equals 12 oz of beer, 5 oz of wine, or 1 oz of hard liquor.  Work with your health care provider to maintain a healthy body weight or to lose weight. Ask what an ideal weight is for you.  Get at least 30 minutes of exercise that causes your heart to beat faster (aerobic exercise) most days of the week. Activities may include walking, swimming, or biking.  Work with your health care provider or diet and nutrition specialist (dietitian) to adjust your eating plan to your individual calorie needs. Reading food labels   Check food labels for the amount of sodium per serving. Choose foods with less than 5 percent of the Daily Value of sodium. Generally, foods with less than 300 mg of sodium per serving fit into this eating plan.  To find whole grains, look for the word "whole" as the first word in the ingredient list. Shopping  Buy products labeled as "low-sodium" or "no salt added."  Buy fresh foods. Avoid canned foods and premade or frozen meals. Cooking  Avoid adding salt when cooking. Use salt-free seasonings or herbs instead of table salt or sea salt. Check with your health care provider or pharmacist before using salt substitutes.  Do not fry foods. Cook foods using healthy methods  such as baking, boiling, grilling, and broiling instead.  Cook with heart-healthy oils, such as olive, canola, soybean, or sunflower oil. Meal planning  Eat a balanced diet that includes: ? 5 or more servings of fruits and vegetables each day. At each meal, try to fill half of your plate with fruits and vegetables. ? Up to 6-8 servings of whole grains each day. ? Less than 6 oz of lean meat, poultry, or fish each day. A 3-oz serving of meat is about the same size as a deck of cards. One egg equals 1 oz. ? 2 servings of low-fat dairy each day. ? A serving of nuts, seeds, or beans 5 times each week. ? Heart-healthy fats. Healthy fats called Omega-3 fatty acids are found in foods such as flaxseeds and coldwater fish, like sardines, salmon, and mackerel.  Limit how much you eat of the following: ? Canned or prepackaged foods. ? Food that is high in trans fat, such as fried foods. ? Food that is high in saturated fat, such as fatty meat. ? Sweets, desserts, sugary drinks, and other foods with added sugar. ? Full-fat dairy products.  Do not salt foods before eating.  Try to eat at least 2 vegetarian meals each week.  Eat more home-cooked food and less restaurant, buffet, and fast food.  When eating at a restaurant, ask that your food be prepared with less salt or no salt, if possible. What foods are recommended?  The items listed may not be a complete list. Talk with your dietitian about what dietary choices are best for you. Grains Whole-grain or whole-wheat bread. Whole-grain or whole-wheat pasta. Brown rice. [place]. Bulgur. Whole-grain and low-sodium cereals. Pita bread. Low-fat, low-sodium crackers. Whole-wheat flour tortillas. Vegetables Fresh or frozen vegetables (raw, steamed, roasted, or grilled). Low-sodium or reduced-sodium tomato and vegetable juice. Low-sodium or reduced-sodium tomato sauce and tomato paste. Low-sodium or reduced-sodium canned vegetables. Fruits All  fresh, dried, or frozen fruit. Canned fruit in natural juice (without added sugar). Meat and other protein foods Skinless chicken or [place]. Ground chicken or [place]. Pork with fat trimmed off. Fish and seafood. Egg whites. Dried beans, peas, or lentils. Unsalted nuts, nut butters, and seeds. Unsalted canned beans. Lean cuts of beef with fat trimmed off. Low-sodium, lean deli meat. Dairy Low-fat (1%) or fat-free (skim) milk. Fat-free, low-fat, or reduced-fat cheeses. Nonfat, low-sodium ricotta or cottage cheese. Low-fat or nonfat yogurt. Low-fat, low-sodium cheese. Fats and oils Soft margarine without trans fats. Vegetable oil. Low-fat, reduced-fat, or light mayonnaise and salad dressings (reduced-sodium). Canola, safflower, olive, soybean, and sunflower oils. Avocado. Seasoning and other foods Herbs. Spices. Seasoning mixes without salt. Unsalted popcorn and pretzels. Fat-free sweets. What foods are not recommended? The items listed may not be a complete list. Talk with your dietitian about what dietary choices are best for you. Grains Baked goods made with fat, such as croissants, muffins, or some breads. Dry pasta or rice meal packs. Vegetables Creamed or fried vegetables. Vegetables in a cheese sauce. Regular canned vegetables (not low-sodium or reduced-sodium). Regular canned tomato sauce and paste (not low-sodium or reduced-sodium). Regular tomato and vegetable juice (not low-sodium or reduced-sodium). Angie Fava. Olives. Fruits Canned fruit in a light or heavy syrup. Fried fruit. Fruit in cream or butter sauce. Meat and other protein foods Fatty cuts of meat. Ribs. Fried meat. Berniece Salines. Sausage. Bologna and other processed lunch meats. Salami. Fatback. Hotdogs. Bratwurst. Salted nuts and seeds. Canned beans with added salt. Canned or smoked fish. Whole eggs or egg yolks. Chicken or [place] with skin. Dairy Whole or 2% milk, cream, and half-and-half. Whole or full-fat cream cheese. Whole-fat or  sweetened yogurt. Full-fat cheese. Nondairy creamers. Whipped toppings. Processed cheese and cheese spreads. Fats and oils Butter. Stick margarine. Lard. Shortening. Ghee. Bacon fat. Tropical oils, such as coconut, palm kernel, or palm oil. Seasoning and other foods Salted popcorn and pretzels. Onion salt, garlic salt, seasoned salt, table salt, and sea salt. Worcestershire sauce. Tartar sauce. Barbecue sauce. Teriyaki sauce. Soy sauce, including reduced-sodium. Steak sauce. Canned and packaged gravies. Fish sauce. Oyster sauce. Cocktail sauce. Horseradish that you find on the shelf. Ketchup. Mustard. Meat flavorings and tenderizers. Bouillon cubes. Hot sauce and Tabasco sauce. Premade or packaged marinades. Premade or packaged taco seasonings. Relishes. Regular salad dressings. Where to find more information:  National Heart, Lung, and [place]: https://wilson-eaton.com/  American Heart Association: www.heart.org Summary  The DASH eating plan is a healthy eating plan that has been shown to reduce high blood pressure (hypertension). It may also reduce your risk for type 2 diabetes, heart disease, and stroke.  With the DASH eating plan, you should limit salt (sodium) intake to 2,300 mg a day. If you have hypertension, you may need to reduce your sodium intake to 1,500 mg a day.  When on the DASH eating plan, aim to eat more fresh fruits and vegetables, whole grains, lean proteins, low-fat dairy, and heart-healthy fats.  Work with your health care  provider or diet and nutrition specialist (dietitian) to adjust your eating plan to your individual calorie needs. This information is not intended to replace advice given to you by your health care provider. Make sure you discuss any questions you have with your health care provider. Document Revised: 08/28/2017 Document Reviewed: 09/08/2016 Elsevier Patient Education  2020 [employer].

## 2020-03-27 NOTE — Progress Notes (Signed)
Chief Complaint  Patient presents with  . Acute Visit    lump under right arm   Annual  1. Right axillary mass noted since 03/03/20 with pain, tenderness and feels like bee sting surrounding area and went from pea size to ooblong and tried zit cream w/o relief now skin discolored and purple hut  2. BP improved 140/70 repeat 126/72    Review of Systems  Constitutional: Negative for weight loss.  HENT: Negative for hearing loss.   Eyes: Negative for blurred vision.  Respiratory: Negative for shortness of breath.   Cardiovascular: Negative for chest pain.  Gastrointestinal: Negative for abdominal pain.  Musculoskeletal: Negative for falls.  Skin: Negative for rash.  Neurological: Negative for headaches.  Psychiatric/Behavioral: Negative for depression.   Past Medical History:  Diagnosis Date  . Vitiligo    arms, legs FH vitiligo   Past Surgical History:  Procedure Laterality Date  . ABDOMINAL HYSTERECTOMY     for endometriosis no h/o abnormal total ovaries uterus,cervix out   . BREAST REDUCTION SURGERY     2001  . CESAREAN SECTION     2000  . CHOLECYSTECTOMY    . REDUCTION MAMMAPLASTY Bilateral 2001   Family History  Problem Relation Age of Onset  . Heart disease Mother   . Thyroid disease Mother   . Heart disease Father   . Hearing loss Father   . Stroke Father   . Birth defects Maternal Grandfather   . Heart disease Maternal Grandfather   . Stroke Maternal Grandfather   . Birth defects Paternal Grandmother   . Heart disease Paternal Grandfather   . Breast cancer Neg Hx    Social History   Socioeconomic History  . Marital status: Married    Spouse name: Not on file  . Number of children: Not on file  . Years of education: Not on file  . Highest education level: Not on file  Occupational History  . Not on file  Tobacco Use  . Smoking status: Never Smoker  . Smokeless tobacco: Never Used  Substance and Sexual Activity  . Alcohol use: Not Currently  . Drug  use: Never  . Sexual activity: Not on file  Other Topics Concern  . Not on file  Social History Narrative   2 kids son and daughter    2nd grade teacher    Married    Owns guns, wears seat belt, safe in relationship   Social Determinants of Health   Financial Resource Strain:   . Difficulty of Paying Living Expenses:   Food Insecurity:   . Worried About Charity fundraiser in the Last Year:   . Arboriculturist in the Last Year:   Transportation Needs:   . Film/video editor (Medical):   Marland Kitchen Lack of Transportation (Non-Medical):   Physical Activity:   . Days of Exercise per Week:   . Minutes of Exercise per Session:   Stress:   . Feeling of Stress :   Social Connections:   . Frequency of Communication with Friends and Family:   . Frequency of Social Gatherings with Friends and Family:   . Attends Religious Services:   . Active Member of Clubs or Organizations:   . Attends Archivist Meetings:   Marland Kitchen Marital Status:   Intimate Partner Violence:   . Fear of Current or Ex-Partner:   . Emotionally Abused:   Marland Kitchen Physically Abused:   . Sexually Abused:    Current Meds  Medication  Sig  . ciprofloxacin (CIPRO) 500 MG tablet Take 1 tablet (500 mg total) by mouth 2 (two) times daily. With food  . clotrimazole (LOTRIMIN) 1 % cream Apply 1 application topically 2 (two) times daily. Right lower abdomen  . Misc Natural Products (OSTEO BI-FLEX ADV TRIPLE ST) TABS Take by mouth daily.  . mupirocin ointment (BACTROBAN) 2 % Apply 1 application topically 2 (two) times daily. Right lower abdomen  . scopolamine (TRANSDERM-SCOP, 1.5 MG,) 1 MG/3DAYS Place 1 patch (1.5 mg total) onto the skin every 3 (three) days. 4 hours prior to motion  . UNABLE TO FIND daily. Med Name: plexus probiotic  . VITAMIN D PO Take 2,000 Units by mouth daily.   No Known Allergies No results found for this or any previous visit (from the past 2160 hour(s)). Objective  Body mass index is 33.47 kg/m. Wt  Readings from Last 3 Encounters:  03/27/20 201 lb 1.9 oz (91.2 kg)  11/25/18 205 lb 6.4 oz (93.2 kg)  08/13/18 199 lb 12.8 oz (90.6 kg)   Temp Readings from Last 3 Encounters:  03/27/20 98.3 F (36.8 C) (Oral)  11/25/18 97.8 F (36.6 C) (Oral)  08/13/18 97.8 F (36.6 C) (Oral)   BP Readings from Last 3 Encounters:  03/27/20 126/72  11/25/18 132/80  08/13/18 130/90   Pulse Readings from Last 3 Encounters:  03/27/20 64  11/25/18 72  08/13/18 72    Physical Exam Vitals and nursing note reviewed.  Constitutional:      Appearance: Normal appearance. She is well-developed and well-groomed.  HENT:     Head: Normocephalic and atraumatic.  Eyes:     Conjunctiva/sclera: Conjunctivae normal.     Pupils: Pupils are equal, round, and reactive to light.  Cardiovascular:     Rate and Rhythm: Normal rate and regular rhythm.     Heart sounds: Normal heart sounds. No murmur heard.      Comments: +right axillar lymph node swelling  Pulmonary:     Effort: Pulmonary effort is normal.     Breath sounds: Normal breath sounds.  Chest:    Lymphadenopathy:     Upper Body:     Right upper body: Axillary adenopathy present.  Skin:    General: Skin is warm and dry.  Neurological:     General: No focal deficit present.     Mental Status: She is alert and oriented to person, place, and time. Mental status is at baseline.     Gait: Gait normal.  Psychiatric:        Attention and Perception: Attention and perception normal.        Mood and Affect: Mood and affect normal.        Speech: Speech normal.        Behavior: Behavior normal. Behavior is cooperative.        Thought Content: Thought content normal.        Cognition and Memory: Cognition and memory normal.        Judgment: Judgment normal.     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 Amadeo [facility] and no record of colonoscopy  Sent [facility] 03/27/20  LMP  2004 s/p hysterectomy endometriosis no h/o abnormal pap total ovaries out prev. On estradiol -no need further paps   Mammogram 10/26/19 normal  Fasting labs due    Axillary mass, right - Plan: US BREAST LTD UNI RIGHT INC AXILLA   Provider: Dr. Olivia Mackie McLean-Scocuzza-[facility]

## 2020-03-28 ENCOUNTER — Other Ambulatory Visit (INDEPENDENT_AMBULATORY_CARE_PROVIDER_SITE_OTHER): Payer: BC Managed Care – PPO

## 2020-03-28 DIAGNOSIS — Z13220 Encounter for screening for lipoid disorders: Secondary | ICD-10-CM

## 2020-03-28 DIAGNOSIS — Z1329 Encounter for screening for other suspected endocrine disorder: Secondary | ICD-10-CM

## 2020-03-28 DIAGNOSIS — Z1389 Encounter for screening for other disorder: Secondary | ICD-10-CM

## 2020-03-28 DIAGNOSIS — Z0000 Encounter for general adult medical examination without abnormal findings: Secondary | ICD-10-CM

## 2020-03-28 NOTE — Addendum Note (Signed)
Addended by: Bonnell Public I on: 03/28/2020 09:52 AM   Modules accepted: Orders

## 2020-03-29 LAB — COMPREHENSIVE METABOLIC PANEL
AG Ratio: 1.3 (calc) (ref 1.0–2.5)
ALT: 16 U/L (ref 6–29)
AST: 20 U/L (ref 10–35)
Albumin: 4.4 g/dL (ref 3.6–5.1)
Alkaline phosphatase (APISO): 114 U/L (ref 37–153)
BUN: 14 mg/dL (ref 7–25)
CO2: 26 mmol/L (ref 20–32)
Calcium: 9.4 mg/dL (ref 8.6–10.4)
Chloride: 103 mmol/L (ref 98–110)
Creat: 0.88 mg/dL (ref 0.50–1.05)
Globulin: 3.4 g/dL (calc) (ref 1.9–3.7)
Glucose, Bld: 100 mg/dL — ABNORMAL HIGH (ref 65–99)
Potassium: 4.6 mmol/L (ref 3.5–5.3)
Sodium: 138 mmol/L (ref 135–146)
Total Bilirubin: 0.6 mg/dL (ref 0.2–1.2)
Total Protein: 7.8 g/dL (ref 6.1–8.1)

## 2020-03-29 LAB — CBC WITH DIFFERENTIAL/PLATELET
Absolute Monocytes: 385 cells/uL (ref 200–950)
Basophils Absolute: 63 cells/uL (ref 0–200)
Basophils Relative: 0.9 %
Eosinophils Absolute: 266 cells/uL (ref 15–500)
Eosinophils Relative: 3.8 %
HCT: 40.5 % (ref 35.0–45.0)
Hemoglobin: 13.2 g/dL (ref 11.7–15.5)
Lymphs Abs: 1302 cells/uL (ref 850–3900)
MCH: 29.1 pg (ref 27.0–33.0)
MCHC: 32.6 g/dL (ref 32.0–36.0)
MCV: 89.2 fL (ref 80.0–100.0)
MPV: 11.3 fL (ref 7.5–12.5)
Monocytes Relative: 5.5 %
Neutro Abs: 4984 cells/uL (ref 1500–7800)
Neutrophils Relative %: 71.2 %
Platelets: 296 10*3/uL (ref 140–400)
RBC: 4.54 10*6/uL (ref 3.80–5.10)
RDW: 13 % (ref 11.0–15.0)
Total Lymphocyte: 18.6 %
WBC: 7 10*3/uL (ref 3.8–10.8)

## 2020-03-29 LAB — URINALYSIS, ROUTINE W REFLEX MICROSCOPIC
Bilirubin Urine: NEGATIVE
Glucose, UA: NEGATIVE
Hgb urine dipstick: NEGATIVE
Ketones, ur: NEGATIVE
Leukocytes,Ua: NEGATIVE
Nitrite: NEGATIVE
Protein, ur: NEGATIVE
Specific Gravity, Urine: 1.012 (ref 1.001–1.03)
pH: 7.5 (ref 5.0–8.0)

## 2020-03-29 LAB — TSH: TSH: 3.06 mIU/L

## 2020-03-29 LAB — LIPID PANEL
Cholesterol: 173 mg/dL (ref <200)
HDL: 63 mg/dL (ref >=50)
LDL Cholesterol (Calc): 90 mg/dL (calc)
Non-HDL Cholesterol (Calc): 110 mg/dL (calc) (ref <130)
Total CHOL/HDL Ratio: 2.7 (calc) (ref <5.0)
Triglycerides: 106 mg/dL (ref <150)

## 2020-04-02 ENCOUNTER — Encounter: Payer: Self-pay | Admitting: Internal Medicine

## 2020-04-02 NOTE — Addendum Note (Signed)
Addended by: Quentin Ore on: 04/02/2020 10:30 PM   Modules accepted: Orders

## 2020-04-03 ENCOUNTER — Telehealth: Payer: Self-pay | Admitting: Internal Medicine

## 2020-04-03 NOTE — Telephone Encounter (Signed)
See Created 04/03/20 telephone encounter

## 2020-04-03 NOTE — Telephone Encounter (Signed)
Hinton, Meredith L routed conversation to You 10 minutes ago (10:45 AM)  Allie Bossier L 10 minutes ago (10:45 AM)  MH   Pt called to inquire about ultrasound. Pt is going on vacation 04/10/20 and wants to get it done before then. Please advise      Documentation   You routed conversation to Taylor, Rasheedah R 15 minutes ago (10:40 AM)  [name] "Pam"  McLean-Scocuzza, Pasty Spillers, MD Yesterday (9:03 AM)  PC I called the insurance. The code you gave me AND the imaging center are in network. Do I call and make an appt for my arm ultrasound?

## 2020-04-03 NOTE — Telephone Encounter (Signed)
Pt called to inquire about ultrasound. Pt is going on vacation 04/10/20 and wants to get it done before then. Please advise

## 2020-04-04 NOTE — Telephone Encounter (Signed)
Pt is returning your call

## 2020-04-18 ENCOUNTER — Ambulatory Visit
Admission: RE | Admit: 2020-04-18 | Discharge: 2020-04-18 | Disposition: A | Discharge status: Home / Self Care | Payer: BC Managed Care – PPO | Source: Ambulatory Visit | Attending: Internal Medicine | Admitting: Internal Medicine

## 2020-04-18 DIAGNOSIS — R2231 Localized swelling, mass and lump, right upper limb: Secondary | ICD-10-CM | POA: Insufficient documentation

## 2020-04-20 ENCOUNTER — Telehealth: Payer: Self-pay | Admitting: Internal Medicine

## 2020-04-20 NOTE — Telephone Encounter (Signed)
Pt was returning your call for results

## 2020-04-22 ENCOUNTER — Other Ambulatory Visit: Payer: Self-pay | Admitting: Internal Medicine

## 2020-04-22 ENCOUNTER — Encounter: Payer: Self-pay | Admitting: Internal Medicine

## 2020-04-22 DIAGNOSIS — L723 Sebaceous cyst: Secondary | ICD-10-CM | POA: Insufficient documentation

## 2020-04-22 NOTE — Addendum Note (Signed)
Addended by: Quentin Ore on: 04/22/2020 08:59 PM   Modules accepted: Orders

## 2020-04-23 NOTE — Telephone Encounter (Signed)
Patient stated she doesn't want referral to surgeon, cyst is a lot better. If sx become worse again she will let PCP know to place referall.

## 2020-04-23 NOTE — Telephone Encounter (Signed)
FYI referral not needed at this time

## 2020-06-27 ENCOUNTER — Other Ambulatory Visit: Payer: Self-pay | Admitting: Internal Medicine

## 2020-06-27 ENCOUNTER — Telehealth (INDEPENDENT_AMBULATORY_CARE_PROVIDER_SITE_OTHER): Payer: BC Managed Care – PPO | Admitting: Internal Medicine

## 2020-06-27 ENCOUNTER — Other Ambulatory Visit: Payer: BC Managed Care – PPO

## 2020-06-27 ENCOUNTER — Other Ambulatory Visit (HOSPITAL_COMMUNITY)
Admission: RE | Admit: 2020-06-27 | Discharge: 2020-06-27 | Disposition: A | Discharge status: Home / Self Care | Payer: BC Managed Care – PPO | Source: Ambulatory Visit | Attending: Internal Medicine | Admitting: Internal Medicine

## 2020-06-27 ENCOUNTER — Other Ambulatory Visit: Payer: Self-pay

## 2020-06-27 VITALS — Ht 65.0 in | Wt 190.0 lb

## 2020-06-27 DIAGNOSIS — R300 Dysuria: Secondary | ICD-10-CM

## 2020-06-27 DIAGNOSIS — B373 Candidiasis of vulva and vagina: Secondary | ICD-10-CM | POA: Diagnosis not present

## 2020-06-27 DIAGNOSIS — N3000 Acute cystitis without hematuria: Secondary | ICD-10-CM | POA: Diagnosis present

## 2020-06-27 DIAGNOSIS — B3731 Acute candidiasis of vulva and vagina: Secondary | ICD-10-CM

## 2020-06-27 MED ORDER — FLUCONAZOLE 150 MG PO TABS: 150.0000 mg | ORAL_TABLET | Freq: Once | ORAL | 0 refills | Status: AC

## 2020-06-27 NOTE — Progress Notes (Signed)
Patient presenting with burning with urination, odor, and pelvic pain rated 4/10. This has been ongoing for about a week. States she used Cranberry juice and this helped a bit.

## 2020-06-28 ENCOUNTER — Telehealth: Payer: Self-pay | Admitting: Internal Medicine

## 2020-06-28 NOTE — Progress Notes (Signed)
Telephone Note  I connected with Candace Joseph  on 06/27/20 at  3:00 PM EDT by a telephone  and verified that I am speaking with the correct person using two identifiers.  Location patient: work Environmental manager or home office Persons participating in the virtual visit: patient, provider  I discussed the limitations of evaluation and management by telemedicine and the availability of in person appointments. The patient expressed understanding and agreed to proceed.   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 She has been wearing a pad to compensate for this. She does have vaginal dryness at times as well no new soap  C/o itching/burning and urine odor, increase freq, smells like cereal milky sweat smell Tried cranberry use last week and helped  She has lower pelvic pain as well  Denies discharge  No fever    ROS: See pertinent positives and negatives per HPI.  Past Medical History:  Diagnosis Date  . Vitiligo    arms, legs FH vitiligo    Past Surgical History:  Procedure Laterality Date  . ABDOMINAL HYSTERECTOMY     for endometriosis no h/o abnormal total ovaries uterus,cervix out   . BREAST REDUCTION SURGERY     2001  . CESAREAN SECTION     2000  . CHOLECYSTECTOMY    . REDUCTION MAMMAPLASTY Bilateral 2001     Current Outpatient Medications:  Marland Kitchen  UNABLE TO FIND, daily. Med Name: plexus probiotic, Disp: , Rfl:  .  ciprofloxacin (CIPRO) 500 MG tablet, Take 1 tablet (500 mg total) by mouth 2 (two) times daily. With food, Disp: 10 tablet, Rfl: 0 .  clotrimazole (LOTRIMIN) 1 % cream, Apply 1 application topically 2 (two) times daily. Right lower abdomen, Disp: 60 g, Rfl: 0 .  Misc Natural Products (OSTEO BI-FLEX ADV TRIPLE ST) TABS, Take by mouth daily., Disp: , Rfl:  .  mupirocin ointment (BACTROBAN) 2 %, Apply 1 application topically 2 (two) times daily. Right lower abdomen, Disp: 30 g, Rfl: 0 .  scopolamine  (TRANSDERM-SCOP, 1.5 MG,) 1 MG/3DAYS, Place 1 patch (1.5 mg total) onto the skin every 3 (three) days. 4 hours prior to motion, Disp: 3 patch, Rfl: 0 .  VITAMIN D PO, Take 2,000 Units by mouth daily., Disp: , Rfl:   EXAM:  VITALS per patient if applicable:  GENERAL: alert, oriented, appears well and in no acute distress  PSYCH/NEURO: pleasant and cooperative, no obvious depression or anxiety, speech and thought processing grossly intact  ASSESSMENT AND PLAN:  Discussed the following assessment and plan:  Acute cystitis without hematuria - Plan: Urinalysis, Routine w reflex microscopic, Urine Culture, Urine cytology ancillary only([place])  Dysuria - Plan: Urinalysis, Routine w reflex microscopic, Urine Culture, Urine cytology ancillary only([place])  Yeast vaginitis - Plan: fluconazole (DIFLUCAN) 150 MG tablet   Fasting labs had 03/28/20 Declines flu shot for now may get in future MMR immune Tdap vx had fast med [place] need to check had 3-4 years ago Hep B immune covid  Vaccine had need to get info per pt had 2/2  Colonoscopy 11/07/16 normal f/u 10 year [facility] had colonoscopy  LMP 2004 s/p hysterectomy endometriosis no h/o abnormal pap total ovaries out prev. On estradiol -no need further paps   Mammogram1/27/21 normal, 04/18/20 normal right axilla sebaceous cyst   Fasting labs utd 03/28/20   -we discussed possible serious and likely etiologies, options for evaluation and workup, limitations of telemedicine visit vs  in person visit, treatment, treatment risks and precautions.   I discussed the assessment and treatment plan with the patient. The patient was provided an opportunity to ask questions and all were answered. The patient agreed with the plan and demonstrated an understanding of the instructions.    Time 20 min Delorise Jackson, MD

## 2020-06-28 NOTE — Progress Notes (Signed)
We have them and the labs will be sent out on the next pickup. Sorry for the delay

## 2020-06-28 NOTE — Telephone Encounter (Signed)
Lvm to set up 9 month appt

## 2020-06-29 LAB — URINE CULTURE
MICRO NUMBER:: 11010356
SPECIMEN QUALITY:: ADEQUATE

## 2020-06-29 LAB — URINALYSIS, ROUTINE W REFLEX MICROSCOPIC
Bilirubin Urine: NEGATIVE
Glucose, UA: NEGATIVE
Hgb urine dipstick: NEGATIVE
Ketones, ur: NEGATIVE
Leukocytes,Ua: NEGATIVE
Nitrite: NEGATIVE
Protein, ur: NEGATIVE
Specific Gravity, Urine: 1.008 (ref 1.001–1.03)
pH: 6 (ref 5.0–8.0)

## 2020-06-29 NOTE — Progress Notes (Signed)
Sorry for the inconvenience the sample will be resent again. It made it to the lab yesterday but I forgot to lab the specimen. So Im resending the sample again this morning. So it should say in process later today.

## 2020-07-02 LAB — MOLECULAR ANCILLARY ONLY
Bacterial Vaginitis-Urine: NEGATIVE
Candida Urine: NEGATIVE

## 2020-07-30 ENCOUNTER — Ambulatory Visit: Payer: BC Managed Care – PPO | Admitting: Dermatology

## 2020-09-03 ENCOUNTER — Telehealth: Payer: Self-pay | Admitting: Internal Medicine

## 2020-09-03 NOTE — Telephone Encounter (Signed)
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

## 2020-09-03 NOTE — Telephone Encounter (Signed)
Please advise 

## 2020-09-03 NOTE — Telephone Encounter (Signed)
Has she had direct exposure with this person w/o a mask? Even vaccinated people can get covid  If so would be tested in 5 days schedule test today for 5 days   If has symptoms see below and my chart patient  There is no medication other than over the counter meds: Mucinex dm green label for cough. Vitamin C 1000 mg daily. Vitamin D3 4000 Iu (units) daily. Zinc 100 mg daily. Quercetin 250-500 mg 2 times per day  Monitor pulse ox, buy from [facility] if oxygen is less than 90 please go to the hospital.     Are you feeling really sick? Shortness of breath, cough, chest pain? If so let me know we can schedule a visit to get antibody infusion If worsening, go to hospital.

## 2020-09-03 NOTE — Telephone Encounter (Signed)
Patient informed and verbalized understanding

## 2020-09-18 ENCOUNTER — Other Ambulatory Visit: Payer: Self-pay | Admitting: Internal Medicine

## 2020-09-18 DIAGNOSIS — Z1231 Encounter for screening mammogram for malignant neoplasm of breast: Secondary | ICD-10-CM

## 2020-10-18 ENCOUNTER — Telehealth: Payer: BC Managed Care – PPO | Admitting: Orthopedic Surgery

## 2020-10-18 DIAGNOSIS — R0989 Other specified symptoms and signs involving the circulatory and respiratory systems: Secondary | ICD-10-CM

## 2020-10-18 MED ORDER — NAPROXEN 500 MG PO TABS: 500.0000 mg | ORAL_TABLET | Freq: Two times a day (BID) | ORAL | 0 refills | Status: DC

## 2020-10-18 MED ORDER — PREDNISONE 20 MG PO TABS: 40.0000 mg | ORAL_TABLET | Freq: Every day | ORAL | 0 refills | Status: AC

## 2020-10-18 NOTE — Progress Notes (Signed)
E-Visit for Corona Virus Screening  Your current symptoms could be consistent with the coronavirus.  Many health care providers can now test patients at their office but not all are.  [facility] has multiple testing sites. For information on our COVID testing locations and hours go to [email or web address]  We are enrolling you in our MyChart Home Monitoring for COVID19 . Daily you will receive a questionnaire within the MyChart website. Our COVID 19 response team will be monitoring your responses daily.  Testing Information: The COVID-19 Community Testing sites are testing BY APPOINTMENT ONLY.  You can schedule online at [email or web address]  If you do not have access to a smart phone or computer you may call [phone] for an appointment.   Additional testing sites in the Community:  . For [facility] Testing sites in [facility]  [email or web address]  . For Pop-up testing sites in [place]  [email or web address]  . For [facility] [email or web address]  . For [facility] testing in [place] and [employer] [email or web address]  . For [facility] testing in [facility]   [email or web address]  For  more information about community testing call [phone]   Please quarantine yourself while awaiting your test results. Please stay home for a minimum of 10 days from the first day of illness with improving symptoms and you have had 24 hours of no fever (without the use of Tylenol (Acetaminophen) Motrin (Ibuprofen) or any fever reducing medication).  Also - Do not get tested prior to returning to work because once you have had a positive test the test can stay  positive for more than a month in some cases.   You should wear a mask or cloth face covering over your nose and mouth if you must be around other people or animals, including pets (even at home). Try to stay at least 6 feet away from other people. This will protect the people around you.  Please continue good preventive care measures, including:  frequent hand-washing, avoid touching your face, cover coughs/sneezes, stay out of crowds and keep a 6 foot distance from others.  COVID-19 is a respiratory illness with symptoms that are similar to the flu. Symptoms are typically mild to moderate, but there have been cases of severe illness and death due to the virus.   The following symptoms may appear 2-14 days after exposure: . Fever . Cough . Shortness of breath or difficulty breathing . Chills . Repeated shaking with chills . Muscle pain . Headache . Sore throat . New loss of taste or smell . Fatigue . Congestion or runny nose . Nausea or vomiting . Diarrhea  Go to the nearest hospital ED for assessment if fever/cough/breathlessness are severe or illness seems like a threat to life.  It is vitally important that if you feel that you have an infection such as this virus or any other virus that you stay home and away from places where you may spread it to others.  You should avoid contact with people age 80 and older.   You can use medication such as A prescription anti-inflammatory called Naprosyn 500 mg. Take twice daily as needed for fever or body aches for 2 weeks. I will also prescribe Prednisone 40mg  daily for 5 days. This will also help if it's a sinus infection. You can use Afrin (over the counter) for your stuffy nose, 2 sprays in each nostril twice a day but can only use it for 5 days or it may cause your symptoms to worsen.  You may also take acetaminophen (Tylenol) as needed for fever.  Reduce your risk of any infection by using the same precautions used for avoiding the common cold  or flu:  Marland Kitchen Wash your hands often with soap and warm water for at least 20 seconds.  If soap and water are not readily available, use an alcohol-based hand sanitizer with at least 60% alcohol.  . If coughing or sneezing, cover your mouth and nose by coughing or sneezing into the elbow areas of your shirt or coat, into a tissue or into your sleeve (not your hands). . Avoid shaking hands with others and consider head nods or verbal greetings only. . Avoid touching your eyes, nose, or mouth with unwashed hands.  . Avoid close contact with people who are sick. . Avoid places or events with large numbers of people in one location, like concerts or sporting events. . Carefully consider travel plans you have or are making. . If you are planning any travel outside or inside the [place], visit the CDC's Travelers' Health webpage for the latest health notices. . If you have some symptoms but not all symptoms, continue to monitor at home and seek medical attention if your symptoms worsen. . If you are having a medical emergency, call 911.  HOME CARE . Only take medications as instructed by your medical team. . Drink plenty of fluids and get plenty of rest. . A steam or ultrasonic humidifier can help if you have congestion.   GET HELP RIGHT AWAY IF YOU HAVE EMERGENCY WARNING SIGNS** FOR COVID-19. If you or someone is showing any of these signs seek emergency medical care immediately. Call 911 or proceed to your closest emergency facility if: . You develop worsening high fever. . Trouble breathing . Bluish lips or face . Persistent pain or pressure in the chest . New confusion . Inability to wake or stay awake . You cough up blood. . Your symptoms become more severe  **This list is not all possible symptoms. Contact your medical provider for any symptoms that are sever or concerning to you.  MAKE SURE YOU   Understand these instructions.  Will watch your condition.  Will get help right away if you  are not doing well or get worse.  Your e-visit answers were reviewed by a board certified advanced clinical practitioner to complete your personal care plan.  Depending on the condition, your plan could have included both over the counter or prescription medications.  If there is a problem please reply once you have received a response from your provider.  Your safety is important to [place].  If you have drug allergies check your prescription carefully.    You can use MyChart to ask questions about today's visit, request a non-urgent call back, or ask for a work or school excuse for 24 hours related to this e-Visit. If it has been greater than 24 hours you will need to follow up with your provider, or enter a new e-Visit to address those concerns. You will get an e-mail in the next two days asking about your experience.  I hope that your e-visit has been valuable and will speed your recovery. Thank you for using e-visits.   Greater than 5 minutes, yet less than 10 minutes of time have been spent researching, coordinating and implementing care for this patient today.

## 2020-10-22 ENCOUNTER — Encounter: Payer: Self-pay | Admitting: Internal Medicine

## 2020-10-24 ENCOUNTER — Telehealth: Payer: Self-pay | Admitting: Internal Medicine

## 2020-10-24 ENCOUNTER — Encounter: Payer: Self-pay | Admitting: Internal Medicine

## 2020-10-24 NOTE — Telephone Encounter (Signed)
Patient was returning call 

## 2020-10-24 NOTE — Telephone Encounter (Signed)
Patient only needing an appointment scheduled virtually for COVID+.  Unless Patient had more questions during your call?

## 2020-10-24 NOTE — Telephone Encounter (Signed)
Left message to return call. Message sent via mychart. See previous Patient message encounter

## 2020-10-24 NOTE — Telephone Encounter (Signed)
Call pt and see if needs to be schedule virtual visit +covid 10/23/20

## 2020-10-25 ENCOUNTER — Encounter: Payer: Self-pay | Admitting: Internal Medicine

## 2020-10-25 ENCOUNTER — Telehealth (INDEPENDENT_AMBULATORY_CARE_PROVIDER_SITE_OTHER): Payer: BC Managed Care – PPO | Admitting: Internal Medicine

## 2020-10-25 ENCOUNTER — Other Ambulatory Visit: Payer: Self-pay

## 2020-10-25 VITALS — Ht 65.0 in | Wt 192.0 lb

## 2020-10-25 DIAGNOSIS — J0110 Acute frontal sinusitis, unspecified: Secondary | ICD-10-CM

## 2020-10-25 DIAGNOSIS — U071 COVID-19: Secondary | ICD-10-CM

## 2020-10-25 DIAGNOSIS — J00 Acute nasopharyngitis [common cold]: Secondary | ICD-10-CM | POA: Diagnosis not present

## 2020-10-25 MED ORDER — CETIRIZINE HCL 10 MG PO TABS: 10.0000 mg | ORAL_TABLET | Freq: Every day | ORAL | 3 refills | Status: DC | PRN

## 2020-10-25 MED ORDER — SALINE SPRAY 0.65 % NA SOLN: 2.0000 | NASAL | 2 refills | Status: DC | PRN

## 2020-10-25 MED ORDER — FLUTICASONE PROPIONATE 50 MCG/ACT NA SUSP: 2.0000 | Freq: Every day | NASAL | 2 refills | Status: DC | PRN

## 2020-10-25 NOTE — Progress Notes (Signed)
Tested positive Monday and got results Tuesday night. Patient was exposed to COVID and started having symptoms.  Current symptoms include nasal drainage, sinus pressure.

## 2020-10-25 NOTE — Patient Instructions (Signed)
There is no medication other than over the counter meds:  Mucinex dm green label for cough.  Vitamin C 1000 mg daily.  Vitamin D3 4000 Iu (units) daily.  Zinc 100 mg daily.  Quercetin 250-500 mg 2 times per day   Elderberry  Oil of oregano  cepacol or chloroseptic spray  Warm tea with honey and lemon  Hydration  Try to eat though you dont feel like it   Tylenol or Advil  Nasal saline  Flonase  Allegra, claritin, xyzal or zyrtec if sneezing runny nose, cough   Monitor pulse oximeter, buy from [facility] if oxygen is less than 90 please go to the hospital.        Are you feeling really sick? Shortness of breath, cough, chest pain?, dizziness? Confusion   If so let me know  If worsening, go to hospital or [facility] clinic Urgent care for further treatment

## 2020-10-25 NOTE — Progress Notes (Signed)
Telephone Note  I connected with Candace Joseph  on 10/25/20 at 11:25 AM EST by telephone and confirmed that I am speaking with the correct person using two identifiers.  Location patient: home, [place] Location provider:work or home office Persons participating in the virtual visit: patient, provider  I discussed the limitations of evaluation and management by telemedicine and the availability of in person appointments. The patient expressed understanding and agreed to proceed.   HPI: Cousin had covid x2 and recently around her 10/13/20 Candace Joseph sx's started 10/16/20 with head cold sinus issues tried mucus relief and dayquil otc and supplements and about 70-80% better had 2/2 covid vaccines. Taste and smell intact. She had urgent care E visit 10/18/20 and given prednisone 40 mg x 5 days and is done with this. Also having runny nose and sneezing at times  Disc otc nose spray and allergy pills  Denies cough, fever, sob  -COVID-19 vaccine status: 2/2  ROS: See pertinent positives and negatives per HPI.  Past Medical History:  Diagnosis Date  . COVID-19    10/23/20  . Vitiligo    arms, legs FH vitiligo    Past Surgical History:  Procedure Laterality Date  . ABDOMINAL HYSTERECTOMY     for endometriosis no h/o abnormal total ovaries uterus,cervix out   . BREAST REDUCTION SURGERY     2001  . CESAREAN SECTION     2000  . CHOLECYSTECTOMY    . REDUCTION MAMMAPLASTY Bilateral 2001     Current Outpatient Medications:  .  cetirizine (ZYRTEC) 10 MG tablet, Take 1 tablet (10 mg total) by mouth daily as needed for allergies., Disp: 90 tablet, Rfl: 3 .  fluticasone (FLONASE) 50 MCG/ACT nasal spray, Place 2 sprays into both nostrils daily as needed for allergies or rhinitis. After nasal saline, Disp: 16 g, Rfl: 2 .  Multiple Vitamins-Minerals (ZINC PO), Take by mouth., Disp: , Rfl:  .  sodium chloride (OCEAN) 0.65 % SOLN nasal spray, Place 2 sprays into both nostrils as needed for congestion.,  Disp: 30 mL, Rfl: 2 .  UNABLE TO FIND, daily. Med Name: plexus probiotic, Disp: , Rfl:  .  VITAMIN D PO, Take 2,000 Units by mouth daily., Disp: , Rfl:  .  naproxen (NAPROSYN) 500 MG tablet, Take 1 tablet (500 mg total) by mouth 2 (two) times daily with a meal. (Patient not taking: Reported on 10/25/2020), Disp: 60 tablet, Rfl: 0  EXAM:  VITALS per patient if applicable:  GENERAL: alert, oriented, appears well and in no acute distress  PSYCH/NEURO: pleasant and cooperative, no obvious depression or anxiety, speech and thought processing grossly intact  ASSESSMENT AND PLAN:  Discussed the following assessment and plan:  COVID-19 + 10/23/20 feeling 70-80% better - Plan: cetirizine (ZYRTEC) 10 MG tablet  Acute frontal sinusitis, recurrence not specified - Plan: sodium chloride (OCEAN) 0.65 % SOLN nasal spray, fluticasone (FLONASE) 50 MCG/ACT nasal spray, cetirizine (ZYRTEC) 10 MG tablet  Acute rhinitis - Plan: cetirizine (ZYRTEC) 10 MG tablet  There is no medication other than over the counter meds:  Mucinex dm green label for cough.  Vitamin C 1000 mg daily.  Vitamin D3 4000 Iu (units) daily.  Zinc 100 mg daily.  Quercetin 250-500 mg 2 times per day  Elderberry  Oil of oregano  cepacol or chloroseptic spray  Warm tea with honey and lemon  Hydration  Try to eat though you dont feel like it  Tylenol or Advil  Nasal saline  Flonase  if sneezing  runny nose claritin, xyzal, zyrtec, or allegra   Monitor pulse oximeter, buy from [facility] if oxygen is less than 90 please go to the hospital.      Are you feeling really sick? Shortness of breath, cough, chest pain?, dizziness? Confusion  If so let me know  If worsening, go to hospital or [facility] clinic Urgent care for further treatment    -we discussed possible serious and likely etiologies, options for evaluation and workup, limitations of telemedicine visit vs in person visit, treatment, treatment risks and precautions.  I  discussed the assessment and treatment plan with the patient. The patient was provided an opportunity to ask questions and all were answered. The patient agreed with the plan and demonstrated an understanding of the instructions.    Time spent 20 minutes Candace Buckles, MD

## 2020-11-21 ENCOUNTER — Ambulatory Visit
Admission: RE | Admit: 2020-11-21 | Discharge: 2020-11-21 | Disposition: A | Discharge status: Home / Self Care | Payer: BC Managed Care – PPO | Source: Ambulatory Visit | Attending: Internal Medicine | Admitting: Internal Medicine

## 2020-11-21 ENCOUNTER — Other Ambulatory Visit: Payer: Self-pay

## 2020-11-21 DIAGNOSIS — Z1231 Encounter for screening mammogram for malignant neoplasm of breast: Secondary | ICD-10-CM | POA: Diagnosis not present

## 2021-02-14 ENCOUNTER — Ambulatory Visit
Admission: EM | Admit: 2021-02-14 | Discharge: 2021-02-14 | Disposition: A | Discharge status: Home / Self Care | Payer: BC Managed Care – PPO | Attending: Emergency Medicine | Admitting: Emergency Medicine

## 2021-02-14 ENCOUNTER — Other Ambulatory Visit: Payer: Self-pay

## 2021-02-14 ENCOUNTER — Encounter: Payer: Self-pay | Admitting: Emergency Medicine

## 2021-02-14 DIAGNOSIS — Z1152 Encounter for screening for COVID-19: Secondary | ICD-10-CM

## 2021-02-14 DIAGNOSIS — B349 Viral infection, unspecified: Secondary | ICD-10-CM

## 2021-02-14 MED ORDER — BENZONATATE 100 MG PO CAPS: 100.0000 mg | ORAL_CAPSULE | Freq: Three times a day (TID) | ORAL | 0 refills | Status: DC | PRN

## 2021-02-14 NOTE — ED Triage Notes (Signed)
Patient c/o nasal congestion and productive cough w/ "yellow to greenish" sputum x 4 days.   Patient denies fever at home.   Patient took at home COVID-19 test Tuesday night with a negative result per patient statement.   Patient endorses headache.   Patient endorses " Chest soreness from coughing"   Patient endorses "a little SOB at times when moving around".   Patient endorses "drainage in throat that's making me cough".   Patient has taken OTC allergy medication and Dayquil/Nyquil with no relief of symptoms.

## 2021-02-14 NOTE — Discharge Instructions (Signed)
Your COVID and Influenza tests are pending.  You should self quarantine until the test results are back.    Take Tylenol or ibuprofen as needed for fever or discomfort.  Rest and keep yourself hydrated.    Follow-up with your primary care provider if your symptoms are not improving.     

## 2021-02-14 NOTE — ED Provider Notes (Signed)
[name]    CSN: [id number] Arrival date & time: 02/14/21  1109      History   Chief Complaint Chief Complaint  Patient presents with  . Nasal Congestion  . Cough    HPI Candace Joseph is a 55 y.o. female.   Patient presents with 4-day history of headache, nasal congestion, cough productive of yellow-green sputum, shortness of breath, chest soreness when coughing.  She denies fever, chills, rash, sore throat, vomiting, diarrhea, or other symptoms.  Treatment attempted with OTC allergy medication and OTC cold medication.  No pertinent medical history.  The history is provided by the patient.    Past Medical History:  Diagnosis Date  . COVID-19    10/23/20  . Vitiligo    arms, legs FH vitiligo    Patient Active Problem List   Diagnosis Date Noted  . Well adult exam 03/27/2020  . Skin lesion 08/13/2018  . Vitiligo 08/13/2018    Past Surgical History:  Procedure Laterality Date  . ABDOMINAL HYSTERECTOMY     for endometriosis no h/o abnormal total ovaries uterus,cervix out   . BREAST REDUCTION SURGERY     2001  . CESAREAN SECTION     2000  . CHOLECYSTECTOMY    . REDUCTION MAMMAPLASTY Bilateral 2001    OB History   No obstetric history on file.      Home Medications    Prior to Admission medications   Medication Sig Start Date End Date Taking? Authorizing Provider  benzonatate (TESSALON) 100 MG capsule Take 1 capsule (100 mg total) by mouth 3 (three) times daily as needed for cough. 02/14/21  Yes Mickie Bail, NP  fluticasone (FLONASE) 50 MCG/ACT nasal spray Place 2 sprays into both nostrils daily as needed for allergies or rhinitis. After nasal saline 10/25/20  Yes McLean-Scocuzza, Pasty Spillers, MD  Multiple Vitamins-Minerals (ZINC PO) Take by mouth.   Yes [provider]  cetirizine (ZYRTEC) 10 MG tablet Take 1 tablet (10 mg total) by mouth daily as needed for allergies. 10/25/20   McLean-Scocuzza, Pasty Spillers, MD  naproxen (NAPROSYN) 500 MG  tablet Take 1 tablet (500 mg total) by mouth 2 (two) times daily with a meal. Patient not taking: No sig reported 10/18/20   Freeman Caldron, PA-C  sodium chloride (OCEAN) 0.65 % SOLN nasal spray Place 2 sprays into both nostrils as needed for congestion. 10/25/20   McLean-Scocuzza, Pasty Spillers, MD  UNABLE TO FIND daily. Med Name: plexus probiotic    [provider]  VITAMIN D PO Take 2,000 Units by mouth daily.    [provider]    Family History Family History  Problem Relation Age of Onset  . Heart disease Mother   . Thyroid disease Mother   . Heart disease Father   . Hearing loss Father   . Stroke Father   . Birth defects Maternal Grandfather   . Heart disease Maternal Grandfather   . Stroke Maternal Grandfather   . Birth defects Paternal Grandmother   . Heart disease Paternal Grandfather   . Breast cancer Neg Hx     Social History Social History   Tobacco Use  . Smoking status: Never Smoker  . Smokeless tobacco: Never Used  Substance Use Topics  . Alcohol use: Not Currently  . Drug use: Never     Allergies   Patient has no known allergies.   Review of Systems Review of Systems  Constitutional: Negative for chills and fever.  HENT: Positive  for congestion, postnasal drip, rhinorrhea and sinus pressure. Negative for ear pain and sore throat.   Respiratory: Positive for cough, chest tightness and shortness of breath.   Cardiovascular: Negative for chest pain and palpitations.  Gastrointestinal: Negative for abdominal pain, diarrhea and vomiting.  Skin: Negative for color change and rash.  Neurological: Positive for headaches. Negative for weakness and numbness.  All other systems reviewed and are negative.    Physical Exam Triage Vital Signs ED Triage Vitals  Enc Vitals Group     BP 02/14/21 1131 112/73     Pulse Rate 02/14/21 1131 76     Resp 02/14/21 1131 15     Temp 02/14/21 1131 98.3 F (36.8 C)     Temp Source 02/14/21 1131 Oral      SpO2 02/14/21 1131 96 %     Weight --      Height --      Head Circumference --      Peak Flow --      Pain Score 02/14/21 1128 8     Pain Loc --      Pain Edu? --      Excl. in GC? --    No data found.  Updated Vital Signs BP 112/73 (BP Location: Right Arm)   Pulse 76   Temp 98.3 F (36.8 C) (Oral)   Resp 15   SpO2 96%   Visual Acuity Right Eye Distance:   Left Eye Distance:   Bilateral Distance:    Right Eye Near:   Left Eye Near:    Bilateral Near:     Physical Exam Vitals and nursing note reviewed.  Constitutional:      General: She is not in acute distress.    Appearance: She is well-developed.  HENT:     Head: Normocephalic and atraumatic.     Right Ear: Tympanic membrane normal.     Left Ear: Tympanic membrane normal.     Nose: Congestion present.     Mouth/Throat:     Mouth: Mucous membranes are moist.     Pharynx: Oropharynx is clear.  Eyes:     Conjunctiva/sclera: Conjunctivae normal.  Cardiovascular:     Rate and Rhythm: Normal rate and regular rhythm.     Heart sounds: Normal heart sounds.  Pulmonary:     Effort: Pulmonary effort is normal. No respiratory distress.     Breath sounds: Normal breath sounds.  Abdominal:     Palpations: Abdomen is soft.     Tenderness: There is no abdominal tenderness.  Musculoskeletal:     Cervical back: Neck supple.  Skin:    General: Skin is warm and dry.  Neurological:     General: No focal deficit present.     Mental Status: She is alert and oriented to person, place, and time.     Gait: Gait normal.  Psychiatric:        Mood and Affect: Mood normal.        Behavior: Behavior normal.      UC Treatments / Results  Labs (all labs ordered are listed, but only abnormal results are displayed) Labs Reviewed - No data to display  EKG   Radiology No results found.  Procedures Procedures (including critical care time)  Medications Ordered in UC Medications - No data to display  Initial  Impression / Assessment and Plan / UC Course  I have reviewed the triage vital signs and the nursing notes.  Pertinent labs & imaging results that  were available during my care of the patient were reviewed by me and considered in my medical decision making (see chart for details).   Viral illness.  Influenza and COVID pending.  Instructed patient to self quarantine until the test results are back.  Treating cough with Tessalon Perles.  Discussed symptomatic treatment including Tylenol or ibuprofen, rest, hydration.  Instructed patient to follow up with PCP if her symptoms are not improving.  Patient agrees to plan of care.    Final Clinical Impressions(s) / UC Diagnoses   Final diagnoses:  Viral illness     Discharge Instructions     Your COVID and Influenza tests are pending.  You should self quarantine until the test results are back.    Take Tylenol or ibuprofen as needed for fever or discomfort.  Rest and keep yourself hydrated.    Follow-up with your primary care provider if your symptoms are not improving.        ED Prescriptions    Medication Sig Dispense Auth. Provider   benzonatate (TESSALON) 100 MG capsule Take 1 capsule (100 mg total) by mouth 3 (three) times daily as needed for cough. 21 capsule Mickie Bail, NP     PDMP not reviewed this encounter.   Mickie Bail, NP 02/14/21 1205

## 2021-02-16 LAB — COVID-19, FLU A+B NAA
Influenza A, NAA: NOT DETECTED
Influenza B, NAA: NOT DETECTED
SARS-CoV-2, NAA: NOT DETECTED

## 2021-03-28 ENCOUNTER — Ambulatory Visit: Payer: BC Managed Care – PPO | Admitting: Internal Medicine

## 2021-03-28 ENCOUNTER — Encounter: Payer: Self-pay | Admitting: Internal Medicine

## 2021-03-28 ENCOUNTER — Other Ambulatory Visit: Payer: Self-pay

## 2021-03-28 VITALS — BP 122/82 | HR 73 | Temp 98.2°F | Ht 65.0 in | Wt 192.8 lb

## 2021-03-28 DIAGNOSIS — Z13220 Encounter for screening for lipoid disorders: Secondary | ICD-10-CM

## 2021-03-28 DIAGNOSIS — Z1329 Encounter for screening for other suspected endocrine disorder: Secondary | ICD-10-CM

## 2021-03-28 DIAGNOSIS — Z1231 Encounter for screening mammogram for malignant neoplasm of breast: Secondary | ICD-10-CM

## 2021-03-28 DIAGNOSIS — Z0000 Encounter for general adult medical examination without abnormal findings: Secondary | ICD-10-CM

## 2021-03-28 DIAGNOSIS — Z23 Encounter for immunization: Secondary | ICD-10-CM | POA: Diagnosis not present

## 2021-03-28 DIAGNOSIS — E559 Vitamin D deficiency, unspecified: Secondary | ICD-10-CM | POA: Diagnosis not present

## 2021-03-28 DIAGNOSIS — R739 Hyperglycemia, unspecified: Secondary | ICD-10-CM | POA: Diagnosis not present

## 2021-03-28 DIAGNOSIS — Z13818 Encounter for screening for other digestive system disorders: Secondary | ICD-10-CM

## 2021-03-28 NOTE — Patient Instructions (Signed)
Consider nurse visit on Thursday or Friday   Zoster Vaccine, Recombinant injection What is this medication? ZOSTER VACCINE (ZOS ter vak SEEN) is a vaccine used to reduce the risk of getting shingles. This vaccine is not used to treat shingles or nerve pain fromshingles. This medicine may be used for other purposes; ask your health care provider orpharmacist if you have questions. COMMON BRAND NAME(S): [facility] What should I tell my care team before I take this medication? They need to know if you have any of these conditions: cancer immune system problems an unusual or allergic reaction to Zoster vaccine, other medications, foods, dyes, or preservatives pregnant or trying to get pregnant breast-feeding How should I use this medication? This vaccine is injected into a muscle. It is given by a health care provider. A copy of Vaccine Information Statements will be given before each vaccination. Be sure to read this information carefully each time. This sheet may changeoften. Talk to your health care provider about the use of this vaccine in children.This vaccine is not approved for use in children. Overdosage: If you think you have taken too much of this medicine contact apoison control center or emergency room at once. NOTE: This medicine is only for you. Do not share this medicine with others. What if I miss a dose? Keep appointments for follow-up (booster) doses. It is important not to miss your dose. Call your health care provider if you are unable to keep anappointment. What may interact with this medication? medicines that suppress your immune system medicines to treat cancer steroid medicines like prednisone or cortisone This list may not describe all possible interactions. Give your health care provider a list of all the medicines, herbs, non-prescription drugs, or dietary supplements you use. Also tell them if you smoke, drink alcohol, or use illegaldrugs. Some items may interact with  your medicine. What should I watch for while using this medication? Visit your health care provider regularly. This vaccine, like all vaccines, may not fully protect everyone. What side effects may I notice from receiving this medication? Side effects that you should report to your doctor or health care professionalas soon as possible: allergic reactions (skin rash, itching or hives; swelling of the face, lips, or tongue) trouble breathing Side effects that usually do not require medical attention (report these toyour doctor or health care professional if they continue or are bothersome): chills headache fever nausea pain, redness, or irritation at site where injected tiredness vomiting This list may not describe all possible side effects. Call your doctor for medical advice about side effects. You may report side effects to FDA at1-800-FDA-1088. Where should I keep my medication? This vaccine is only given by a health care provider. It will not be stored athome. NOTE: This sheet is a summary. It may not cover all possible information. If you have questions about this medicine, talk to your doctor, pharmacist, orhealth care provider.  2022 Elsevier/Gold Standard (2019-10-21 16:23:07)

## 2021-03-28 NOTE — Progress Notes (Signed)
Chief Complaint  Patient presents with   Follow-up   Annual  Covid 01/2021 doing well since and not sure if will get booster shot  Otherwise no complaints family doing well lives with MIL now due to dementia   Review of Systems  Constitutional:  Negative for weight loss.  HENT:  Negative for hearing loss.   Eyes:  Negative for blurred vision.  Respiratory:  Negative for shortness of breath.   Cardiovascular:  Negative for chest pain.  Gastrointestinal:  Negative for abdominal pain.  Musculoskeletal:  Negative for falls and joint pain.  Skin:  Negative for rash.  Neurological:  Negative for headaches.  Psychiatric/Behavioral:  Negative for depression.   Past Medical History:  Diagnosis Date   COVID-19    10/23/20   Vitiligo    arms, legs FH vitiligo   Past Surgical History:  Procedure Laterality Date   ABDOMINAL HYSTERECTOMY     for endometriosis no h/o abnormal total ovaries uterus,cervix out    BREAST REDUCTION SURGERY     2001   CESAREAN SECTION     2000   CHOLECYSTECTOMY     REDUCTION MAMMAPLASTY Bilateral 2001   Family History  Problem Relation Age of Onset   Heart disease Mother    Thyroid disease Mother    Heart disease Father    Hearing loss Father    Stroke Father    Birth defects Maternal Grandfather    Heart disease Maternal Grandfather    Stroke Maternal Grandfather    Birth defects Paternal Grandmother    Heart disease Paternal Grandfather    Breast cancer Neg Hx    Social History   Socioeconomic History   Marital status: Married    Spouse name: Not on file   Number of children: Not on file   Years of education: Not on file   Highest education level: Not on file  Occupational History   Not on file  Tobacco Use   Smoking status: Never   Smokeless tobacco: Never  Substance and Sexual Activity   Alcohol use: Not Currently   Drug use: Never   Sexual activity: Not on file  Other Topics Concern   Not on file  Social History Narrative   2 kids  son and daughter    2nd grade teacher    Married    Owns guns, wears seat belt, safe in relationship   Social Determinants of Health   Financial Resource Strain: Not on file  Food Insecurity: Not on file  Transportation Needs: Not on file  Physical Activity: Not on file  Stress: Not on file  Social Connections: Not on file  Intimate Partner Violence: Not on file   Current Meds  Medication Sig   cetirizine (ZYRTEC) 10 MG tablet Take 1 tablet (10 mg total) by mouth daily as needed for allergies.   fluticasone (FLONASE) 50 MCG/ACT nasal spray Place 2 sprays into both nostrils daily as needed for allergies or rhinitis. After nasal saline   Probiotic Product (PROBIOTIC PO) Take by mouth in the morning and at bedtime. Plexus   [DISCONTINUED] UNABLE TO FIND daily. Med Name: plexus probiotic   No Known Allergies Recent Results (from the past 2160 hour(s))  Covid-19, Flu A+B ([facility])     Status: None   Collection Time: 02/14/21 12:25 PM   Specimen: Nasopharyngeal   Naso  Result Value Ref Range   SARS-CoV-2, NAA Not Detected Not Detected    Comment: This nucleic acid amplification test was developed and  its performance characteristics determined by [employer]. Nucleic acid amplification tests include RT-PCR and TMA. This test has not been FDA cleared or approved. This test has been authorized by FDA under an Emergency Use Authorization (EUA). This test is only authorized for the duration of time the declaration that circumstances exist justifying the authorization of the emergency use of in vitro diagnostic tests for detection of SARS-CoV-2 virus and/or diagnosis of COVID-19 infection under section 564(b)(1) of the Act, 21 U.S.C. 283MOQ-9(U) (1), unless the authorization is terminated or revoked sooner. When diagnostic testing is negative, the possibility of a false negative result should be considered in the context of a patient's recent exposures and the presence of  clinical signs and symptoms consistent with COVID-19. An individual without symptoms of COVID-19 and who is not shedding SARS-CoV-2 virus wo uld expect to have a negative (not detected) result in this assay.    Influenza A, NAA Not Detected Not Detected   Influenza B, NAA Not Detected Not Detected   Objective  Body mass index is 32.08 kg/m. Wt Readings from Last 3 Encounters:  03/28/21 192 lb 12.8 oz (87.5 kg)  10/25/20 192 lb (87.1 kg)  06/27/20 190 lb (86.2 kg)   Temp Readings from Last 3 Encounters:  03/28/21 98.2 F (36.8 C) (Oral)  02/14/21 98.3 F (36.8 C) (Oral)  03/27/20 98.3 F (36.8 C) (Oral)   BP Readings from Last 3 Encounters:  03/28/21 122/82  02/14/21 112/73  03/27/20 126/72   Pulse Readings from Last 3 Encounters:  03/28/21 73  02/14/21 76  03/27/20 64    Physical Exam Vitals and nursing note reviewed.  Constitutional:      Appearance: Normal appearance. She is well-developed and well-groomed. She is obese.  HENT:     Head: Normocephalic and atraumatic.  Eyes:     Conjunctiva/sclera: Conjunctivae normal.     Pupils: Pupils are equal, round, and reactive to light.  Cardiovascular:     Rate and Rhythm: Normal rate and regular rhythm.     Heart sounds: Normal heart sounds.  Pulmonary:     Effort: Pulmonary effort is normal. No respiratory distress.     Breath sounds: Normal breath sounds.  Abdominal:     General: Abdomen is flat. Bowel sounds are normal.  Skin:    General: Skin is warm and moist.  Neurological:     General: No focal deficit present.     Mental Status: She is alert and oriented to person, place, and time. Mental status is at baseline.     Gait: Gait normal.  Psychiatric:        Attention and Perception: Attention and perception normal.        Mood and Affect: Mood and affect normal.        Speech: Speech normal.        Behavior: Behavior normal. Behavior is cooperative.        Thought Content: Thought content normal.         Cognition and Memory: Cognition and memory normal.        Judgment: Judgment normal.    Assessment  Plan  Annual physical exam - Plan: Comprehensive metabolic panel, Lipid panel, CBC with Differential/Platelet, TSH, Urinalysis, Routine w reflex microscopic, Vitamin D (25 hydroxy), Hemoglobin A1c Fasting labs had 03/28/20 Declines flu shot for now may get in future  MMR immune  Tdap vx given today Consider shingrix x 2 Hep B immune  covid  Vaccine had need to get info  per pt had 2/2 not sure if will get booster   Colonoscopy 11/07/16 normal f/u 10 year [place] [facility] had colonoscopy   LMP 2004 s/p hysterectomy endometriosis no h/o abnormal pap total ovaries out prev. On estradiol -no need further paps   Mammogram 10/26/19 normal, 04/18/20 normal right axilla sebaceous cyst Utd neg mammo 10/2020 ordered 2023  Rec healthy diet and exercise   Provider: Dr. Olivia Mackie McLean-Scocuzza-Internal Medicine

## 2021-03-29 ENCOUNTER — Other Ambulatory Visit (INDEPENDENT_AMBULATORY_CARE_PROVIDER_SITE_OTHER): Payer: BC Managed Care – PPO

## 2021-03-29 ENCOUNTER — Other Ambulatory Visit: Payer: Self-pay

## 2021-03-29 DIAGNOSIS — Z1329 Encounter for screening for other suspected endocrine disorder: Secondary | ICD-10-CM

## 2021-03-29 DIAGNOSIS — R739 Hyperglycemia, unspecified: Secondary | ICD-10-CM

## 2021-03-29 DIAGNOSIS — Z13220 Encounter for screening for lipoid disorders: Secondary | ICD-10-CM | POA: Diagnosis not present

## 2021-03-29 DIAGNOSIS — Z0000 Encounter for general adult medical examination without abnormal findings: Secondary | ICD-10-CM | POA: Diagnosis not present

## 2021-03-29 DIAGNOSIS — Z13818 Encounter for screening for other digestive system disorders: Secondary | ICD-10-CM

## 2021-03-29 DIAGNOSIS — E559 Vitamin D deficiency, unspecified: Secondary | ICD-10-CM

## 2021-03-29 LAB — COMPREHENSIVE METABOLIC PANEL
ALT: 14 U/L (ref 0–35)
AST: 19 U/L (ref 0–37)
Albumin: 4.3 g/dL (ref 3.5–5.2)
Alkaline Phosphatase: 106 U/L (ref 39–117)
BUN: 16 mg/dL (ref 6–23)
CO2: 27 mEq/L (ref 19–32)
Calcium: 9.1 mg/dL (ref 8.4–10.5)
Chloride: 97 mEq/L (ref 96–112)
Creatinine, Ser: 0.82 mg/dL (ref 0.40–1.20)
GFR: 80.98 mL/min (ref >60.00)
Glucose, Bld: 88 mg/dL (ref 70–99)
Potassium: 4.1 mEq/L (ref 3.5–5.1)
Sodium: 133 mEq/L — ABNORMAL LOW (ref 135–145)
Total Bilirubin: 0.7 mg/dL (ref 0.2–1.2)
Total Protein: 7.7 g/dL (ref 6.0–8.3)

## 2021-03-29 LAB — CBC WITH DIFFERENTIAL/PLATELET
Basophils Absolute: 0.1 10*3/uL (ref 0.0–0.1)
Basophils Relative: 0.9 % (ref 0.0–3.0)
Eosinophils Absolute: 0.2 10*3/uL (ref 0.0–0.7)
Eosinophils Relative: 4 % (ref 0.0–5.0)
HCT: 40.6 % (ref 36.0–46.0)
Hemoglobin: 13.4 g/dL (ref 12.0–15.0)
Lymphocytes Relative: 25.5 % (ref 12.0–46.0)
Lymphs Abs: 1.6 10*3/uL (ref 0.7–4.0)
MCHC: 33 g/dL (ref 30.0–36.0)
MCV: 90.7 fl (ref 78.0–100.0)
Monocytes Absolute: 0.4 10*3/uL (ref 0.1–1.0)
Monocytes Relative: 6.4 % (ref 3.0–12.0)
Neutro Abs: 3.9 10*3/uL (ref 1.4–7.7)
Neutrophils Relative %: 63.2 % (ref 43.0–77.0)
Platelets: 283 10*3/uL (ref 150.0–400.0)
RBC: 4.48 Mil/uL (ref 3.87–5.11)
RDW: 14.1 % (ref 11.5–15.5)
WBC: 6.1 10*3/uL (ref 4.0–10.5)

## 2021-03-29 LAB — LIPID PANEL
Cholesterol: 197 mg/dL (ref 0–200)
HDL: 64.9 mg/dL (ref >39.00)
LDL Cholesterol: 107 mg/dL — ABNORMAL HIGH (ref 0–99)
NonHDL: 131.92
Total CHOL/HDL Ratio: 3
Triglycerides: 123 mg/dL (ref 0.0–149.0)
VLDL: 24.6 mg/dL (ref 0.0–40.0)

## 2021-03-29 LAB — URINALYSIS, ROUTINE W REFLEX MICROSCOPIC
Bilirubin Urine: NEGATIVE
Hgb urine dipstick: NEGATIVE
Ketones, ur: NEGATIVE
Leukocytes,Ua: NEGATIVE
Nitrite: NEGATIVE
Specific Gravity, Urine: 1.01 (ref 1.000–1.030)
Total Protein, Urine: NEGATIVE
Urine Glucose: NEGATIVE
Urobilinogen, UA: 0.2 (ref 0.0–1.0)
pH: 7.5 (ref 5.0–8.0)

## 2021-03-29 LAB — VITAMIN D 25 HYDROXY (VIT D DEFICIENCY, FRACTURES): VITD: 28.81 ng/mL — ABNORMAL LOW (ref 30.00–100.00)

## 2021-03-29 LAB — HEMOGLOBIN A1C: Hgb A1c MFr Bld: 5.3 % (ref 4.6–6.5)

## 2021-03-29 LAB — TSH: TSH: 4.38 u[IU]/mL (ref 0.35–5.50)

## 2021-04-01 ENCOUNTER — Encounter: Payer: Self-pay | Admitting: Internal Medicine

## 2021-04-01 DIAGNOSIS — E559 Vitamin D deficiency, unspecified: Secondary | ICD-10-CM | POA: Insufficient documentation

## 2021-04-02 LAB — HEPATITIS C ANTIBODY
Hepatitis C Ab: NONREACTIVE
SIGNAL TO CUT-OFF: 0.03 (ref <1.00)

## 2021-04-03 ENCOUNTER — Telehealth: Payer: Self-pay | Admitting: Internal Medicine

## 2021-04-03 NOTE — Telephone Encounter (Signed)
PT called to return the missed call for results  

## 2021-04-04 ENCOUNTER — Telehealth: Payer: Self-pay

## 2021-04-04 DIAGNOSIS — E871 Hypo-osmolality and hyponatremia: Secondary | ICD-10-CM

## 2021-04-04 NOTE — Telephone Encounter (Signed)
Labs order for Basic metabolic panel for future labs

## 2021-04-18 ENCOUNTER — Other Ambulatory Visit (INDEPENDENT_AMBULATORY_CARE_PROVIDER_SITE_OTHER): Payer: BC Managed Care – PPO

## 2021-04-18 ENCOUNTER — Other Ambulatory Visit: Payer: Self-pay

## 2021-04-18 DIAGNOSIS — E871 Hypo-osmolality and hyponatremia: Secondary | ICD-10-CM | POA: Diagnosis not present

## 2021-04-18 LAB — BASIC METABOLIC PANEL
BUN: 11 mg/dL (ref 6–23)
CO2: 25 mEq/L (ref 19–32)
Calcium: 9.2 mg/dL (ref 8.4–10.5)
Chloride: 102 mEq/L (ref 96–112)
Creatinine, Ser: 0.89 mg/dL (ref 0.40–1.20)
GFR: 73.37 mL/min (ref >60.00)
Glucose, Bld: 96 mg/dL (ref 70–99)
Potassium: 4.3 mEq/L (ref 3.5–5.1)
Sodium: 136 mEq/L (ref 135–145)

## 2021-05-03 ENCOUNTER — Other Ambulatory Visit: Payer: Self-pay

## 2021-05-03 ENCOUNTER — Ambulatory Visit (INDEPENDENT_AMBULATORY_CARE_PROVIDER_SITE_OTHER): Payer: BC Managed Care – PPO

## 2021-05-03 DIAGNOSIS — Z23 Encounter for immunization: Secondary | ICD-10-CM | POA: Diagnosis not present

## 2021-07-08 ENCOUNTER — Ambulatory Visit: Payer: BC Managed Care – PPO

## 2021-07-11 ENCOUNTER — Ambulatory Visit (INDEPENDENT_AMBULATORY_CARE_PROVIDER_SITE_OTHER): Payer: BC Managed Care – PPO

## 2021-07-11 ENCOUNTER — Other Ambulatory Visit: Payer: Self-pay

## 2021-07-11 DIAGNOSIS — Z23 Encounter for immunization: Secondary | ICD-10-CM

## 2021-07-11 NOTE — Progress Notes (Signed)
Patient presented for shingrix injection to left deltoid, patient voiced no concerns nor showed any signs of distress during injection. 

## 2021-07-29 ENCOUNTER — Encounter: Payer: Self-pay | Admitting: Internal Medicine

## 2021-07-29 ENCOUNTER — Telehealth: Payer: Self-pay | Admitting: Internal Medicine

## 2021-07-29 NOTE — Telephone Encounter (Signed)
Anyone with appts today?  Can we work her in double book in the am 7:45, 11:45 or 1:15? 07/30/21  Virtual visit

## 2021-07-29 NOTE — Telephone Encounter (Signed)
Pt called in stating that she is not feeling well. Pt stated that she woke up at 4am with sinus pressure, headache. Slight fever, and drainage in back of throat. Pt would like for Dr. To prescribe medicine.

## 2021-07-29 NOTE — Telephone Encounter (Signed)
   Patient was seen by urgent care. Note in the chart, For your information

## 2021-07-29 NOTE — Telephone Encounter (Signed)
Patient was seen by urgent care. Note in the chart, For your information

## 2021-09-06 ENCOUNTER — Telehealth: Payer: Self-pay | Admitting: Internal Medicine

## 2021-09-06 ENCOUNTER — Other Ambulatory Visit: Payer: BC Managed Care – PPO

## 2021-09-06 ENCOUNTER — Other Ambulatory Visit: Payer: Self-pay

## 2021-09-06 DIAGNOSIS — N3000 Acute cystitis without hematuria: Secondary | ICD-10-CM

## 2021-09-06 NOTE — Telephone Encounter (Signed)
Patient calling in with itching and burning that is worsening with OTC AZO not working. Bruining with and without urination, urinary frequency, no odor.   Patient advised to be seen by PCP office in the next 24 hours.

## 2021-09-06 NOTE — Telephone Encounter (Signed)
Left message to return call on home number. Message received not in service when calling mobile number.   Okay to schedule Patient for soonest available provider If calling back in. If no appointments available with any of the [place] offices Patient will need to be seen by urgent care.   Patient already triaged by access nurse.

## 2021-09-06 NOTE — Telephone Encounter (Signed)
Patient called back in and scheduled to be seen by front desk staff 09/09/21

## 2021-09-06 NOTE — Telephone Encounter (Signed)
Can do urine lab test today and do urine today  and sch f/u appt uti  Orders in

## 2021-09-06 NOTE — Telephone Encounter (Signed)
Patient coming to complete urine sample at 4:00 pm

## 2021-09-07 LAB — URINALYSIS, ROUTINE W REFLEX MICROSCOPIC
Bacteria, UA: NONE SEEN /HPF
Bilirubin Urine: NEGATIVE
Glucose, UA: NEGATIVE
Hyaline Cast: NONE SEEN /LPF
Ketones, ur: NEGATIVE
Nitrite: NEGATIVE
Specific Gravity, Urine: 1.018 (ref 1.001–1.035)
WBC, UA: >=60 /HPF — AB (ref 0–5)
pH: 6.5 (ref 5.0–8.0)

## 2021-09-07 LAB — URINE CULTURE
MICRO NUMBER:: 12737944
Result:: NO GROWTH
SPECIMEN QUALITY:: ADEQUATE

## 2021-09-07 LAB — MICROSCOPIC MESSAGE

## 2021-09-09 ENCOUNTER — Ambulatory Visit (INDEPENDENT_AMBULATORY_CARE_PROVIDER_SITE_OTHER): Payer: BC Managed Care – PPO | Admitting: Adult Health

## 2021-09-09 ENCOUNTER — Encounter: Payer: Self-pay | Admitting: Adult Health

## 2021-09-09 ENCOUNTER — Other Ambulatory Visit: Payer: Self-pay

## 2021-09-09 VITALS — BP 132/80 | HR 72 | Temp 96.0°F | Ht 65.0 in | Wt 193.2 lb

## 2021-09-09 DIAGNOSIS — R3129 Other microscopic hematuria: Secondary | ICD-10-CM

## 2021-09-09 DIAGNOSIS — R82998 Other abnormal findings in urine: Secondary | ICD-10-CM

## 2021-09-09 LAB — POCT URINALYSIS DIPSTICK
Bilirubin, UA: NEGATIVE
Blood, UA: NEGATIVE
Glucose, UA: NEGATIVE
Ketones, UA: NEGATIVE
Nitrite, UA: NEGATIVE
Protein, UA: NEGATIVE
Spec Grav, UA: 1.01 (ref 1.010–1.025)
Urobilinogen, UA: 0.2 E.U./dL
pH, UA: 5.5 (ref 5.0–8.0)

## 2021-09-09 NOTE — Progress Notes (Signed)
Leukocytes in urine today no blood POCT , on nitrofurantoin. No blood today, symptoms improving sent for repeat microscopic and urine culture. Follow up in 2 weeks, questionable if she may have passed kidney stone. If any urinary symptoms worsen she will call back to office to consider CT renal and referral to urology. At this time she would like to defer. Follow up in 2 weeks sooner if worsening persistent.  History of full hysterotomy.

## 2021-09-09 NOTE — Patient Instructions (Signed)
Urinary Tract Infection, Adult A urinary tract infection (UTI) is an infection of any part of the urinary tract. The urinary tract includes: The kidneys. The ureters. The bladder. The urethra. These organs make, store, and get rid of pee (urine) in the body. What are the causes? This infection is caused by germs (bacteria) in your genital area. These germs grow and cause swelling (inflammation) of your urinary tract. What increases the risk? The following factors may make you more likely to develop this condition: Using a small, thin tube (catheter) to drain pee. Not being able to control when you pee or poop (incontinence). Being female. If you are female, these things can increase the risk: Using these methods to prevent pregnancy: A medicine that kills sperm (spermicide). A device that blocks sperm (diaphragm). Having low levels of a female hormone (estrogen). Being pregnant. You are more likely to develop this condition if: You have genes that add to your risk. You are sexually active. You take antibiotic medicines. You have trouble peeing because of: A prostate that is bigger than normal, if you are female. A blockage in the part of your body that drains pee from the bladder. A kidney stone. A nerve condition that affects your bladder. Not getting enough to drink. Not peeing often enough. You have other conditions, such as: Diabetes. A weak disease-fighting system (immune system). Sickle cell disease. Gout. Injury of the spine. What are the signs or symptoms? Symptoms of this condition include: Needing to pee right away. Peeing small amounts often. Pain or burning when peeing. Blood in the pee. Pee that smells bad or not like normal. Trouble peeing. Pee that is cloudy. Fluid coming from the vagina, if you are female. Pain in the belly or lower back. Other symptoms include: Vomiting. Not feeling hungry. Feeling mixed up (confused). This may be the first symptom in  older adults. Being tired and grouchy (irritable). A fever. Watery poop (diarrhea). How is this treated? Taking antibiotic medicine. Taking other medicines. Drinking enough water. In some cases, you may need to see a specialist. Follow these instructions at home: Medicines Take over-the-counter and prescription medicines only as told by your doctor. If you were prescribed an antibiotic medicine, take it as told by your doctor. Do not stop taking it even if you start to feel better. General instructions Make sure you: Pee until your bladder is empty. Do not hold pee for a long time. Empty your bladder after sex. Wipe from front to back after peeing or pooping if you are a female. Use each tissue one time when you wipe. Drink enough fluid to keep your pee pale yellow. Keep all follow-up visits. Contact a doctor if: You do not get better after 1-2 days. Your symptoms go away and then come back. Get help right away if: You have very bad back pain. You have very bad pain in your lower belly. You have a fever. You have chills. You feeling like you will vomit or you vomit. Summary A urinary tract infection (UTI) is an infection of any part of the urinary tract. This condition is caused by germs in your genital area. There are many risk factors for a UTI. Treatment includes antibiotic medicines. Drink enough fluid to keep your pee pale yellow. This information is not intended to replace advice given to you by your health care provider. Make sure you discuss any questions you have with your health care provider. Document Revised: 04/27/2020 Document Reviewed: 04/27/2020 Elsevier Patient Education  2022   [employer] Hematuria, Adult Hematuria is blood in the urine. Blood may be visible in the urine, or it may be identified with a test. This condition can be caused by infections of the bladder, urethra, kidney, or prostate. Other possible causes include: Kidney stones. Cancer of the  urinary tract. Too much calcium in the urine. Conditions that are passed from parent to child (inherited conditions). Exercise that requires a lot of energy. Infections can usually be treated with medicine, and a kidney stone usually will pass through your urine. If neither of these is the cause of your hematuria, more tests may be needed to identify the cause of your symptoms. It is very important to tell your health care provider about any blood in your urine, even if it is painless or the blood stops without treatment. Blood in the urine, when it happens and then stops and then happens again, can be a symptom of a very serious condition, including cancer. There is no pain in the initial stages of many urinary cancers. Follow these instructions at home: Medicines Take over-the-counter and prescription medicines only as told by your health care provider. If you were prescribed an antibiotic medicine, take it as told by your health care provider. Do not stop taking the antibiotic even if you start to feel better. Eating and drinking Drink enough fluid to keep your urine pale yellow. It is recommended that you drink 3-4 quarts (2.8-3.8 L) a day. If you have been diagnosed with an infection, drinking cranberry juice in addition to large amounts of water is recommended. Avoid caffeine, tea, and carbonated beverages. These tend to irritate the bladder. Avoid alcohol because it may irritate the prostate (in males). General instructions If you have been diagnosed with a kidney stone, follow your health care provider's instructions about straining your urine to catch the stone. Empty your bladder often. Avoid holding urine for long periods of time. If you are female: After a bowel movement, wipe from front to back and use each piece of toilet paper only once. Empty your bladder before and after sex. Pay attention to any changes in your symptoms. Tell your health care provider about any changes or any  new symptoms. It is up to you to get the results of any tests. Ask your health care provider, or the department that is doing the test, when your results will be ready. Keep all follow-up visits. This is important. Contact a health care provider if: You develop back pain. You have a fever or chills. You have nausea or vomiting. Your symptoms do not improve after 3 days. Your symptoms get worse. Get help right away if: You develop severe vomiting and are unable to take medicine without vomiting. You develop severe pain in your back or abdomen even though you are taking medicine. You pass a large amount of blood in your urine. You pass blood clots in your urine. You feel very weak or like you might faint. You faint. Summary Hematuria is blood in the urine. It has many possible causes. It is very important that you tell your health care provider about any blood in your urine, even if it is painless or the blood stops without treatment. Take over-the-counter and prescription medicines only as told by your health care provider. Drink enough fluid to keep your urine pale yellow. This information is not intended to replace advice given to you by your health care provider. Make sure you discuss any questions you have with your health care provider.  Document Revised: 05/16/2020 Document Reviewed: 05/16/2020 Elsevier Patient Education  2022 [employer].

## 2021-09-09 NOTE — Progress Notes (Signed)
Acute Office Visit  Subjective:    Patient ID: Candace Joseph, female    DOB: [birth year], 55 y.o.   MRN: [id number]  Chief Complaint  Patient presents with   Urinary Urgency   Dysuria    Dysuria  Associated symptoms include frequency and urgency.  Patient is in today for follow up she had a urinalysis on 09/06/21 with hematuria and leukocytes and negative urine culture. Nitrofurantoin .She does feel better after antibiotic reports symptoms have improved substantially. Saturday had red blood on tissue- scant amount. She had started nitrofurantoin before coming her for urine on 12 9 she reports she had taken 1 tablet.  Tomorrow she will finish her nitrofurantoin.  She still has some mild pelvic pressure as well as lower back pain.  She reports burping, improved  POCT today shows no blood, and trace leukocytes.   She does have some hesitancy with urination and felt like balloon pressure.  This has subsided for the most part and she feels much better. Full Hysterectomy for endometriosis.    Negative urine.   Patient  denies any fever, body aches,chills, rash, chest pain, shortness of breath, nausea, vomiting, or diarrhea.   Past Medical History:  Diagnosis Date   COVID-19    10/23/20   Sinusitis    07/29/21 sinusitis + flu seen tx'ed urgent care tamiflu and augmentin   Vitiligo    arms, legs FH vitiligo    Past Surgical History:  Procedure Laterality Date   ABDOMINAL HYSTERECTOMY     for endometriosis no h/o abnormal total ovaries uterus,cervix out    BREAST REDUCTION SURGERY     2001   CESAREAN SECTION     2000   CHOLECYSTECTOMY     REDUCTION MAMMAPLASTY Bilateral 2001    Family History  Problem Relation Age of Onset   Heart disease Mother    Thyroid disease Mother    Heart disease Father    Hearing loss Father    Stroke Father    Birth defects Maternal Grandfather    Heart disease Maternal Grandfather    Stroke Maternal Grandfather    Birth defects Paternal  Grandmother    Heart disease Paternal Grandfather    Breast cancer Neg Hx     Social History   Socioeconomic History   Marital status: Married    Spouse name: Not on file   Number of children: Not on file   Years of education: Not on file   Highest education level: Not on file  Occupational History   Not on file  Tobacco Use   Smoking status: Never   Smokeless tobacco: Never  Substance and Sexual Activity   Alcohol use: Not Currently   Drug use: Never   Sexual activity: Not on file  Other Topics Concern   Not on file  Social History Narrative   2 kids son and daughter    2nd grade teacher    Married    Owns guns, wears seat belt, safe in relationship   Social Determinants of Health   Financial Resource Strain: Not on file  Food Insecurity: Not on file  Transportation Needs: Not on file  Physical Activity: Not on file  Stress: Not on file  Social Connections: Not on file  Intimate Partner Violence: Not on file    Outpatient Medications Prior to Visit  Medication Sig Dispense Refill   nitrofurantoin, macrocrystal-monohydrate, (MACROBID) 100 MG capsule Take 100 mg by mouth 2 (two) times daily.     phenazopyridine (  PYRIDIUM) 200 MG tablet PLEASE SEE ATTACHED FOR DETAILED DIRECTIONS     Probiotic Product (PROBIOTIC PO) Take by mouth in the morning and at bedtime. Plexus     cetirizine (ZYRTEC) 10 MG tablet Take 1 tablet (10 mg total) by mouth daily as needed for allergies. (Patient not taking: Reported on 09/09/2021) 90 tablet 3   fluticasone (FLONASE) 50 MCG/ACT nasal spray Place 2 sprays into both nostrils daily as needed for allergies or rhinitis. After nasal saline (Patient not taking: Reported on 09/09/2021) 16 g 2   No facility-administered medications prior to visit.    No Known Allergies  Review of Systems  Constitutional: Negative.   HENT: Negative.    Eyes: Negative.   Respiratory: Negative.    Cardiovascular: Negative.   Gastrointestinal: Negative.    Genitourinary:  Positive for dysuria, frequency and urgency. Negative for decreased urine volume, vaginal discharge and vaginal pain.  Psychiatric/Behavioral: Negative.        Objective:    Physical Exam Vitals reviewed.  Constitutional:      General: She is not in acute distress.    Appearance: She is well-developed. She is not diaphoretic.     Interventions: She is not intubated. HENT:     Head: Normocephalic and atraumatic.     Right Ear: External ear normal.     Left Ear: External ear normal.     Nose: Nose normal.     Mouth/Throat:     Pharynx: No oropharyngeal exudate.  Eyes:     General: Lids are normal. No scleral icterus.       Right eye: No discharge.        Left eye: No discharge.     Conjunctiva/sclera: Conjunctivae normal.     Right eye: Right conjunctiva is not injected. No exudate or hemorrhage.    Left eye: Left conjunctiva is not injected. No exudate or hemorrhage.    Pupils: Pupils are equal, round, and reactive to light.  Neck:     Thyroid: No thyroid mass or thyromegaly.     Vascular: Normal carotid pulses. No carotid bruit, hepatojugular reflux or JVD.     Trachea: Trachea and phonation normal. No tracheal tenderness or tracheal deviation.     Meningeal: Brudzinski's sign and Kernig's sign absent.  Cardiovascular:     Rate and Rhythm: Normal rate and regular rhythm.     Pulses: Normal pulses.          Radial pulses are 2+ on the right side and 2+ on the left side.       Dorsalis pedis pulses are 2+ on the right side and 2+ on the left side.       Posterior tibial pulses are 2+ on the right side and 2+ on the left side.     Heart sounds: Normal heart sounds, S1 normal and S2 normal. Heart sounds not distant. No murmur heard.   No friction rub. No gallop.  Pulmonary:     Effort: Pulmonary effort is normal. No tachypnea, bradypnea, accessory muscle usage or respiratory distress. She is not intubated.     Breath sounds: Normal breath sounds. No stridor. No  wheezing or rales.  Chest:     Chest wall: No tenderness.  Abdominal:     General: Bowel sounds are normal. There is no distension or abdominal bruit.     Palpations: Abdomen is soft. There is no shifting dullness, fluid wave, hepatomegaly, splenomegaly, mass or pulsatile mass.     Tenderness: There is  no abdominal tenderness. There is no guarding or rebound.     Hernia: No hernia is present.  Musculoskeletal:        General: No tenderness or deformity. Normal range of motion.     Cervical back: Full passive range of motion without pain, normal range of motion and neck supple. No edema, erythema or rigidity. No spinous process tenderness or muscular tenderness. Normal range of motion.  Lymphadenopathy:     Head:     Right side of head: No submental, submandibular, tonsillar, preauricular, posterior auricular or occipital adenopathy.     Left side of head: No submental, submandibular, tonsillar, preauricular, posterior auricular or occipital adenopathy.     Cervical: No cervical adenopathy.     Right cervical: No superficial, deep or posterior cervical adenopathy.    Left cervical: No superficial, deep or posterior cervical adenopathy.     Upper Body:     Right upper body: No supraclavicular or pectoral adenopathy.     Left upper body: No supraclavicular or pectoral adenopathy.  Skin:    General: Skin is warm and dry.     Coloration: Skin is not pale.     Findings: No abrasion, bruising, burn, ecchymosis, erythema, lesion, petechiae or rash.     Nails: There is no clubbing.  Neurological:     Mental Status: She is alert and oriented to person, place, and time.     GCS: GCS eye subscore is 4. GCS verbal subscore is 5. GCS motor subscore is 6.     Cranial Nerves: No cranial nerve deficit.     Sensory: No sensory deficit.     Motor: No tremor, atrophy, abnormal muscle tone or seizure activity.     Coordination: Coordination normal.     Gait: Gait normal.     Deep Tendon Reflexes:  Reflexes are normal and symmetric. Reflexes normal. Babinski sign absent on the right side. Babinski sign absent on the left side.     Reflex Scores:      Tricep reflexes are 2+ on the right side and 2+ on the left side.      Bicep reflexes are 2+ on the right side and 2+ on the left side.      Brachioradialis reflexes are 2+ on the right side and 2+ on the left side.      Patellar reflexes are 2+ on the right side and 2+ on the left side.      Achilles reflexes are 2+ on the right side and 2+ on the left side. Psychiatric:        Mood and Affect: Mood normal.        Speech: Speech normal.        Behavior: Behavior normal.        Thought Content: Thought content normal.        Judgment: Judgment normal.    BP 132/80   Pulse 72   Temp (!) 96 F (35.6 C)   Ht 5\' 5"  (1.651 m)   Wt 193 lb 3.2 oz (87.6 kg)   SpO2 96%   BMI 32.15 kg/m  Wt Readings from Last 3 Encounters:  09/09/21 193 lb 3.2 oz (87.6 kg)  03/28/21 192 lb 12.8 oz (87.5 kg)  10/25/20 192 lb (87.1 kg)    Health Maintenance Due  Topic Date Due   HIV Screening  Never done   INFLUENZA VACCINE  Never done    There are no preventive care reminders to display for this patient.  Lab Results  Component Value Date   TSH 4.38 03/29/2021   Lab Results  Component Value Date   WBC 7.8 09/09/2021   HGB 12.4 09/09/2021   HCT 37.8 09/09/2021   MCV 90.6 09/09/2021   PLT 286.0 09/09/2021   Lab Results  Component Value Date   NA 134 (L) 09/09/2021   K 3.9 09/09/2021   CO2 29 09/09/2021   GLUCOSE 89 09/09/2021   BUN 13 09/09/2021   CREATININE 0.83 09/09/2021   BILITOT 0.5 09/09/2021   ALKPHOS 100 09/09/2021   AST 19 09/09/2021   ALT 15 09/09/2021   PROT 7.3 09/09/2021   ALBUMIN 4.2 09/09/2021   CALCIUM 9.1 09/09/2021   GFR 79.56 09/09/2021   Lab Results  Component Value Date   CHOL 197 03/29/2021   Lab Results  Component Value Date   HDL 64.90 03/29/2021   Lab Results  Component Value Date   LDLCALC  107 (H) 03/29/2021   Lab Results  Component Value Date   TRIG 123.0 03/29/2021   Lab Results  Component Value Date   CHOLHDL 3 03/29/2021   Lab Results  Component Value Date   HGBA1C 5.3 03/29/2021       Assessment & Plan:   Problem List Items Addressed This Visit   None Visit Diagnoses     Hematuria, microscopic    -  Primary   Relevant Orders   Urinalysis, Routine w reflex microscopic (Completed)   CBC with Differential/Platelet (Completed)   Comprehensive Metabolic Panel (CMET) (Completed)   Urine Culture (Completed)   POCT Urinalysis Dipstick (Completed)   Leukocytes in urine          Complete antibiotic currently on. Recheck urine, patients symptoms improving.   Need recheck 2 weeks of urine to rule out no blood and no bacteria after completing antibiotics.    Red Flags discussed. The patient was given clear instructions to go to ER or return to medical center if any red flags develop, symptoms do not improve, worsen or new problems develop. They verbalized understanding.  Return in about 2 weeks (around 09/23/2021), or if symptoms worsen or fail to improve, for Go to Emergency room/ urgent care if worse, at any time for any worsening symptoms.    No orders of the defined types were placed in this encounter.     Marcille Buffy, FNP

## 2021-09-10 ENCOUNTER — Telehealth: Payer: Self-pay

## 2021-09-10 LAB — URINALYSIS, ROUTINE W REFLEX MICROSCOPIC
Bilirubin Urine: NEGATIVE
Hgb urine dipstick: NEGATIVE
Ketones, ur: NEGATIVE
Leukocytes,Ua: NEGATIVE
Nitrite: NEGATIVE
RBC / HPF: NONE SEEN (ref 0–?)
Specific Gravity, Urine: 1.01 (ref 1.000–1.030)
Total Protein, Urine: NEGATIVE
Urine Glucose: NEGATIVE
Urobilinogen, UA: 0.2 (ref 0.0–1.0)
pH: 6 (ref 5.0–8.0)

## 2021-09-10 LAB — CBC WITH DIFFERENTIAL/PLATELET
Basophils Absolute: 0.1 10*3/uL (ref 0.0–0.1)
Basophils Relative: 0.8 % (ref 0.0–3.0)
Eosinophils Absolute: 0.3 10*3/uL (ref 0.0–0.7)
Eosinophils Relative: 3.5 % (ref 0.0–5.0)
HCT: 37.8 % (ref 36.0–46.0)
Hemoglobin: 12.4 g/dL (ref 12.0–15.0)
Lymphocytes Relative: 23.8 % (ref 12.0–46.0)
Lymphs Abs: 1.9 10*3/uL (ref 0.7–4.0)
MCHC: 32.8 g/dL (ref 30.0–36.0)
MCV: 90.6 fl (ref 78.0–100.0)
Monocytes Absolute: 0.5 10*3/uL (ref 0.1–1.0)
Monocytes Relative: 5.9 % (ref 3.0–12.0)
Neutro Abs: 5.2 10*3/uL (ref 1.4–7.7)
Neutrophils Relative %: 66 % (ref 43.0–77.0)
Platelets: 286 10*3/uL (ref 150.0–400.0)
RBC: 4.17 Mil/uL (ref 3.87–5.11)
RDW: 14.1 % (ref 11.5–15.5)
WBC: 7.8 10*3/uL (ref 4.0–10.5)

## 2021-09-10 LAB — COMPREHENSIVE METABOLIC PANEL
ALT: 15 U/L (ref 0–35)
AST: 19 U/L (ref 0–37)
Albumin: 4.2 g/dL (ref 3.5–5.2)
Alkaline Phosphatase: 100 U/L (ref 39–117)
BUN: 13 mg/dL (ref 6–23)
CO2: 29 mEq/L (ref 19–32)
Calcium: 9.1 mg/dL (ref 8.4–10.5)
Chloride: 97 mEq/L (ref 96–112)
Creatinine, Ser: 0.83 mg/dL (ref 0.40–1.20)
GFR: 79.56 mL/min (ref >60.00)
Glucose, Bld: 89 mg/dL (ref 70–99)
Potassium: 3.9 mEq/L (ref 3.5–5.1)
Sodium: 134 mEq/L — ABNORMAL LOW (ref 135–145)
Total Bilirubin: 0.5 mg/dL (ref 0.2–1.2)
Total Protein: 7.3 g/dL (ref 6.0–8.3)

## 2021-09-10 LAB — URINE CULTURE
MICRO NUMBER:: 12744635
Result:: NO GROWTH
SPECIMEN QUALITY:: ADEQUATE

## 2021-09-10 NOTE — Telephone Encounter (Signed)
-----   Message from Bevelyn Buckles, MD sent at 09/09/2021  9:08 AM EST ----- Urine with blood but no UTI  Does pt want referral to urology?  Maybe has kidney stone or was she on cycle? Appt today at 4 pm  This blood in urine needs further work up

## 2021-09-11 NOTE — Progress Notes (Signed)
Patient was seen for this on 09/09/21.

## 2021-09-11 NOTE — Progress Notes (Signed)
No infection notes, urinalysis send off was within normal limits.  CMP and CBC ok.  Follow up if any symptoms persisting or not resolved at anytime. If any more bleeding return to office recommend keeping routine health care with PCP and follow up urinalysis microscopic and next vist.

## 2021-09-15 ENCOUNTER — Encounter: Payer: Self-pay | Admitting: Adult Health

## 2021-09-25 ENCOUNTER — Other Ambulatory Visit: Payer: Self-pay

## 2021-09-25 ENCOUNTER — Encounter: Payer: Self-pay | Admitting: Internal Medicine

## 2021-09-25 ENCOUNTER — Ambulatory Visit (INDEPENDENT_AMBULATORY_CARE_PROVIDER_SITE_OTHER): Payer: BC Managed Care – PPO | Admitting: Internal Medicine

## 2021-09-25 VITALS — BP 124/84 | HR 71 | Temp 97.1°F | Ht 65.0 in | Wt 192.8 lb

## 2021-09-25 DIAGNOSIS — N200 Calculus of kidney: Secondary | ICD-10-CM

## 2021-09-25 DIAGNOSIS — E2839 Other primary ovarian failure: Secondary | ICD-10-CM

## 2021-09-25 DIAGNOSIS — N907 Vulvar cyst: Secondary | ICD-10-CM | POA: Diagnosis not present

## 2021-09-25 DIAGNOSIS — R319 Hematuria, unspecified: Secondary | ICD-10-CM | POA: Diagnosis not present

## 2021-09-25 DIAGNOSIS — Z13220 Encounter for screening for lipoid disorders: Secondary | ICD-10-CM

## 2021-09-25 DIAGNOSIS — E559 Vitamin D deficiency, unspecified: Secondary | ICD-10-CM

## 2021-09-25 DIAGNOSIS — Z1329 Encounter for screening for other suspected endocrine disorder: Secondary | ICD-10-CM

## 2021-09-25 DIAGNOSIS — Z0000 Encounter for general adult medical examination without abnormal findings: Secondary | ICD-10-CM

## 2021-09-25 DIAGNOSIS — N390 Urinary tract infection, site not specified: Secondary | ICD-10-CM

## 2021-09-25 MED ORDER — DOXYCYCLINE HYCLATE 100 MG PO TABS: 100.0000 mg | ORAL_TABLET | Freq: Two times a day (BID) | ORAL | 0 refills | Status: DC

## 2021-09-25 NOTE — Progress Notes (Signed)
Chief Complaint  Patient presents with   Follow-up   F/u  Hematuria FH kidney stones daughter and dad had right flank and lower ab pain with sxs ~09/06/21 an dblood in urine and urinary hesistancy, freq burning Had appt NP and given macrobid   Cysts to left labia painful and swollen around Xmas tried warm compress bath soak and dermaplast but still painful but less    Review of Systems  Constitutional:  Negative for weight loss.  HENT:  Negative for hearing loss.   Eyes:  Negative for blurred vision.  Respiratory:  Negative for shortness of breath.   Cardiovascular:  Negative for chest pain.  Gastrointestinal:  Negative for abdominal pain and blood in stool.  Genitourinary:  Negative for dysuria.  Musculoskeletal:  Negative for falls and joint pain.  Skin:  Negative for rash.  Neurological:  Negative for headaches.  Psychiatric/Behavioral:  Negative for depression.   Past Medical History:  Diagnosis Date   COVID-19    10/23/20   Flu    2022   Sinusitis    07/29/21 sinusitis + flu seen tx'ed urgent care tamiflu and augmentin   Vitiligo    arms, legs FH vitiligo   Past Surgical History:  Procedure Laterality Date   ABDOMINAL HYSTERECTOMY     for endometriosis no h/o abnormal total ovaries uterus,cervix out    BREAST REDUCTION SURGERY     2001   CESAREAN SECTION     2000   CHOLECYSTECTOMY     REDUCTION MAMMAPLASTY Bilateral 2001   Family History  Problem Relation Age of Onset   Heart disease Mother    Thyroid disease Mother    Heart disease Father    Hearing loss Father    Stroke Father    Kidney Stones Father    Birth defects Maternal Grandfather    Heart disease Maternal Grandfather    Stroke Maternal Grandfather    Birth defects Paternal Grandmother    Heart disease Paternal Grandfather    Kidney Stones Son    Breast cancer Neg Hx    Social History   Socioeconomic History   Marital status: Married    Spouse name: Not on file   Number of children: Not on  file   Years of education: Not on file   Highest education level: Not on file  Occupational History   Not on file  Tobacco Use   Smoking status: Never   Smokeless tobacco: Never  Substance and Sexual Activity   Alcohol use: Not Currently   Drug use: Never   Sexual activity: Not on file  Other Topics Concern   Not on file  Social History Narrative   2 kids son and daughter    2nd grade teacher    Married    Owns guns, wears seat belt, safe in relationship   Social Determinants of Health   Financial Resource Strain: Not on file  Food Insecurity: Not on file  Transportation Needs: Not on file  Physical Activity: Not on file  Stress: Not on file  Social Connections: Not on file  Intimate Partner Violence: Not on file   Current Meds  Medication Sig   cetirizine (ZYRTEC) 10 MG tablet Take 1 tablet (10 mg total) by mouth daily as needed for allergies.   doxycycline (VIBRA-TABS) 100 MG tablet Take 1 tablet (100 mg total) by mouth 2 (two) times daily. With food   Probiotic Product (PROBIOTIC PO) Take by mouth in the morning and at bedtime. Plexus  No Known Allergies Recent Results (from the past 2160 hour(s))  Urinalysis, Routine w reflex microscopic     Status: Abnormal   Collection Time: 09/06/21  3:50 PM  Result Value Ref Range   Color, Urine YELLOW YELLOW   APPearance CLEAR CLEAR   Specific Gravity, Urine 1.018 1.001 - 1.035   pH 6.5 5.0 - 8.0   Glucose, UA NEGATIVE NEGATIVE   Bilirubin Urine NEGATIVE NEGATIVE   Ketones, ur NEGATIVE NEGATIVE   Hgb urine dipstick 2+ (A) NEGATIVE   Protein, ur 1+ (A) NEGATIVE   Nitrite NEGATIVE NEGATIVE   Leukocytes,Ua 2+ (A) NEGATIVE   WBC, UA > OR = 60 (A) 0 - 5 /HPF   RBC / HPF 40-60 (A) 0 - 2 /HPF   Squamous Epithelial / LPF 0-5 < OR = 5 /HPF   Bacteria, UA NONE SEEN NONE SEEN /HPF   Hyaline Cast NONE SEEN NONE SEEN /LPF  Urine Culture     Status: None   Collection Time: 09/06/21  3:50 PM   Specimen: Urine  Result Value Ref  Range   MICRO NUMBER: [id number]    SPECIMEN QUALITY: Adequate    Sample Source NOT GIVEN    STATUS: FINAL    Result: No Growth   MICROSCOPIC MESSAGE     Status: None   Collection Time: 09/06/21  3:50 PM  Result Value Ref Range   Note      Comment: This urine was analyzed for the presence of WBC,  RBC, bacteria, casts, and other formed elements.  Only those elements seen were reported. . .   Urinalysis, Routine w reflex microscopic     Status: None   Collection Time: 09/09/21  4:24 PM  Result Value Ref Range   Color, Urine YELLOW Yellow;Lt. Yellow;Straw;Dark Yellow;Amber;Green;Red;Brown   APPearance CLEAR Clear;Turbid;Slightly Cloudy;Cloudy   Specific Gravity, Urine 1.010 1.000 - 1.030   pH 6.0 5.0 - 8.0   Total Protein, Urine NEGATIVE Negative   Urine Glucose NEGATIVE Negative   Ketones, ur NEGATIVE Negative   Bilirubin Urine NEGATIVE Negative   Hgb urine dipstick NEGATIVE Negative   Urobilinogen, UA 0.2 0.0 - 1.0   Leukocytes,Ua NEGATIVE Negative   Nitrite NEGATIVE Negative   WBC, UA 0-2/hpf 0-2/hpf   RBC / HPF none seen 0-2/hpf   Squamous Epithelial / LPF Rare(0-4/hpf) Rare(0-4/hpf)  CBC with Differential/Platelet     Status: None   Collection Time: 09/09/21  4:24 PM  Result Value Ref Range   WBC 7.8 4.0 - 10.5 K/uL   RBC 4.17 3.87 - 5.11 Mil/uL   Hemoglobin 12.4 12.0 - 15.0 g/dL   HCT 37.8 36.0 - 46.0 %   MCV 90.6 78.0 - 100.0 fl   MCHC 32.8 30.0 - 36.0 g/dL   RDW 14.1 11.5 - 15.5 %   Platelets 286.0 150.0 - 400.0 K/uL   Neutrophils Relative % 66.0 43.0 - 77.0 %   Lymphocytes Relative 23.8 12.0 - 46.0 %   Monocytes Relative 5.9 3.0 - 12.0 %   Eosinophils Relative 3.5 0.0 - 5.0 %   Basophils Relative 0.8 0.0 - 3.0 %   Neutro Abs 5.2 1.4 - 7.7 K/uL   Lymphs Abs 1.9 0.7 - 4.0 K/uL   Monocytes Absolute 0.5 0.1 - 1.0 K/uL   Eosinophils Absolute 0.3 0.0 - 0.7 K/uL   Basophils Absolute 0.1 0.0 - 0.1 K/uL  Comprehensive Metabolic Panel (CMET)     Status: Abnormal    Collection Time: 09/09/21  4:24 PM  Result Value Ref Range   Sodium 134 (L) 135 - 145 mEq/L   Potassium 3.9 3.5 - 5.1 mEq/L   Chloride 97 96 - 112 mEq/L   CO2 29 19 - 32 mEq/L   Glucose, Bld 89 70 - 99 mg/dL   BUN 13 6 - 23 mg/dL   Creatinine, Ser 0.83 0.40 - 1.20 mg/dL   Total Bilirubin 0.5 0.2 - 1.2 mg/dL   Alkaline Phosphatase 100 39 - 117 U/L   AST 19 0 - 37 U/L   ALT 15 0 - 35 U/L   Total Protein 7.3 6.0 - 8.3 g/dL   Albumin 4.2 3.5 - 5.2 g/dL   GFR 79.56 >60.00 mL/min    Comment: Calculated using the CKD-EPI Creatinine Equation (2021)   Calcium 9.1 8.4 - 10.5 mg/dL  Urine Culture     Status: None   Collection Time: 09/09/21  4:24 PM   Specimen: Urine  Result Value Ref Range   MICRO NUMBER: [id number]    SPECIMEN QUALITY: Adequate    Sample Source NOT GIVEN    STATUS: FINAL    Result: No Growth   POCT Urinalysis Dipstick     Status: Abnormal   Collection Time: 09/09/21  4:40 PM  Result Value Ref Range   Color, UA Yellow    Clarity, UA Clear    Glucose, UA Negative Negative   Bilirubin, UA Negative    Ketones, UA Negative    Spec Grav, UA 1.010 1.010 - 1.025   Blood, UA Negative    pH, UA 5.5 5.0 - 8.0   Protein, UA Negative Negative   Urobilinogen, UA 0.2 0.2 or 1.0 E.U./dL   Nitrite, UA Negative    Leukocytes, UA Trace (A) Negative   Appearance     Odor     Objective  Body mass index is 32.08 kg/m. Wt Readings from Last 3 Encounters:  09/25/21 192 lb 12.8 oz (87.5 kg)  09/09/21 193 lb 3.2 oz (87.6 kg)  03/28/21 192 lb 12.8 oz (87.5 kg)   Temp Readings from Last 3 Encounters:  09/25/21 (!) 97.1 F (36.2 C) (Temporal)  09/09/21 (!) 96 F (35.6 C)  03/28/21 98.2 F (36.8 C) (Oral)   BP Readings from Last 3 Encounters:  09/25/21 124/84  09/09/21 132/80  03/28/21 122/82   Pulse Readings from Last 3 Encounters:  09/25/21 71  09/09/21 72  03/28/21 73    Physical Exam Vitals and nursing note reviewed.  Constitutional:      Appearance: Normal  appearance. She is well-developed and well-groomed.  HENT:     Head: Normocephalic and atraumatic.  Eyes:     Conjunctiva/sclera: Conjunctivae normal.     Pupils: Pupils are equal, round, and reactive to light.  Cardiovascular:     Rate and Rhythm: Normal rate and regular rhythm.     Heart sounds: Normal heart sounds. No murmur heard. Pulmonary:     Effort: Pulmonary effort is normal.     Breath sounds: Normal breath sounds.  Abdominal:     General: Abdomen is flat. Bowel sounds are normal.     Tenderness: There is no abdominal tenderness.  Musculoskeletal:        General: No tenderness.  Skin:    General: Skin is warm and dry.  Neurological:     General: No focal deficit present.     Mental Status: She is alert and oriented to person, place, and time. Mental status is at baseline.     Cranial  Nerves: Cranial nerves 2-12 are intact.     Gait: Gait is intact.  Psychiatric:        Attention and Perception: Attention and perception normal.        Mood and Affect: Mood and affect normal.        Speech: Speech normal.        Behavior: Behavior normal. Behavior is cooperative.        Thought Content: Thought content normal.        Cognition and Memory: Cognition and memory normal.        Judgment: Judgment normal.    Assessment  Plan  Hematuria, unspecified type - Plan: CT ABDOMEN PELVIS WO CONTRAST, Comprehensive metabolic panel, CBC with Differential/Platelet  Labial cyst - Plan: doxycycline (VIBRA-TABS) 100 MG tablet bid x 7-10 days   HM Fasting labs 03/31/22 Declines flu shot for now may get in future  MMR immune  Tdap vx 03/28/21  Shingrix 2/2 Hep B immune  covid  Vaccine had need to get info per pt had 2/2 declines boosters   Colonoscopy 11/07/16 normal f/u 10 year [place] had colonoscopy   LMP 2004 s/p hysterectomy endometriosis no h/o abnormal pap total ovaries out prev. On estradiol -no need further paps   Dexa ordered   Mammogram 10/26/19 normal, 04/18/20  normal right axilla sebaceous cyst Utd neg mammo 10/2020 ordered 2023  Due 11/21/21 ordered   Rec healthy diet and exercise     Provider: Dr. Olivia Mackie McLean-Scocuzza-[facility]

## 2021-09-25 NOTE — Patient Instructions (Addendum)
Call and schedule mammogram 11/21/21   Bartholin's Cyst A Bartholin's cyst is a fluid-filled sac that forms as a result of a blockage along the tube (duct) of the Bartholin's gland. Bartholin's glands are small glands in the folds of skin around the vaginal opening (labia). These glands produce fluid to moisten or lubricate the outside of the vagina during sex. A cyst that is not large or infected may not cause any problems or require treatment. If the cyst gets infected with bacteria, it is called a Bartholin's abscess. An abscess may cause symptoms such as pain and swelling and is more likely to require treatment. What are the causes? This condition may be caused by a blocked Bartholin's gland duct. These ducts can become blocked due to natural buildup of fluid and oils. Bacteria inside of the cyst can cause infection. In many cases, the cause is not known. What are the signs or symptoms? Symptoms may include: A bulge or lump on the labia, near the lower opening of the vagina. Discomfort or pain. This may get worse during sex or when walking. Redness, swelling, or fluid draining from the area. These may be signs of an abscess. How severe your symptoms are depends on the size of your cyst and whether it is infected. Infection causes symptoms to get more severe. How is this diagnosed? This condition may be diagnosed based on: Your symptoms and medical history. A physical exam to check for swelling in your vaginal area. You may lie on your back on an exam table and have your feet placed into footrests for the exam. Blood tests to check for infections. Removal of a fluid sample from the cyst or abscess (biopsy) for testing. You may work with a health care provider who specializes in [employer] health (gynecologist) for diagnosis and treatment. How is this treated? If your cyst is small, not infected, and not causing symptoms, you may not need treatment. These cysts often go away on their own, with  home care such as hot baths or warm compresses. If you have a large cyst or an abscess, treatment may include: Antibiotic medicine. A procedure to drain the fluid inside the cyst or abscess. These procedures involve making an incision in the cyst or abscess so that the fluid drains out, and then one of the following may be done: A small, thin tube (catheter) may be placed inside the cyst or abscess so that it does not close and fill up with fluid again (fistulization). The catheter will be removed at a follow-up visit. The edges of the incision may be stitched to your skin so that the cyst or abscess stays open (marsupialization). This allows it to continue to drain and not fill up with fluid again. If you have cysts or abscesses that keep returning (recurring) and have required incision and drainage multiple times, your health care provider may talk with you about surgery to remove the Bartholin's gland. Follow these instructions at home: Medicines Take over-the-counter and prescription medicines only as told by your health care provider. If you were prescribed an antibiotic medicine, take it as told by your health care provider. Do not stop taking the antibiotic even if your condition improves. Managing pain and swelling Try sitz baths to help with pain and swelling. A sitz bath is a warm water bath in which the water only comes up to your hips and should cover your buttocks. You may take sitz baths several times a day. Apply heat to the affected area as  often as needed. Use the heat source that your health care provider recommends, such as a moist heat pack or a heating pad. Place a towel between your skin and the heat source. Leave the heat on for 20-30 minutes. Remove the heat if your skin turns bright red. This is especially important if you are unable to feel pain, heat, or cold. You may have a greater risk of getting burned. Do not fall asleep with the heating pad in place. General  instructions If your cyst or abscess was drained, follow instructions from your health care provider about how to take care of your wound. Use feminine pads as needed to absorb any drainage. Do not push on or squeeze your cyst. Do not have sex until the cyst has gone away or your wound from drainage has healed. Take these steps to help prevent a Bartholin's cyst from returning and to prevent other Bartholin's cysts from developing: Take a bath or shower once a day. Clean your vaginal area with mild soap and water when you bathe. Practice safe sex to prevent STIs. Talk with your health care provider about how to prevent STIs and which forms of birth control (contraception) may be best for you. Keep all follow-up visits. This is important. Contact a health care provider if: You have a fever. You develop increasing redness, swelling, or pain around your cyst. You have fluid, blood, pus, or a bad smell coming from your cyst. You have a cyst that gets larger or comes back. Summary A Bartholin's cyst is a fluid-filled sac that forms as a result of a blockage along the duct of the Bartholin's gland. If your cyst is small, not infected, and not causing symptoms, you may not need any treatment. If you have a large cyst or an abscess, your health care provider may perform a procedure to drain the fluid. If you have cysts or abscesses that keep returning (recurring) and have required incision and drainage multiple times, your health care provider may talk with you about surgery to remove the Bartholin's gland. This information is not intended to replace advice given to you by your health care provider. Make sure you discuss any questions you have with your health care provider. Document Revised: 02/13/2020 Document Reviewed: 02/13/2020 Elsevier Patient Education  2022 [employer].

## 2021-10-03 ENCOUNTER — Ambulatory Visit
Admission: RE | Admit: 2021-10-03 | Discharge: 2021-10-03 | Disposition: A | Discharge status: Home / Self Care | Payer: BC Managed Care – PPO | Source: Ambulatory Visit | Attending: Internal Medicine | Admitting: Internal Medicine

## 2021-10-03 ENCOUNTER — Other Ambulatory Visit: Payer: Self-pay

## 2021-10-03 DIAGNOSIS — R319 Hematuria, unspecified: Secondary | ICD-10-CM | POA: Insufficient documentation

## 2021-10-04 ENCOUNTER — Encounter: Payer: Self-pay | Admitting: Internal Medicine

## 2021-10-04 DIAGNOSIS — N200 Calculus of kidney: Secondary | ICD-10-CM | POA: Insufficient documentation

## 2021-10-04 NOTE — Addendum Note (Signed)
Addended by: Quentin Ore on: 10/04/2021 10:32 AM   Modules accepted: Orders

## 2021-10-09 ENCOUNTER — Telehealth: Payer: Self-pay

## 2021-10-09 ENCOUNTER — Ambulatory Visit: Payer: BC Managed Care – PPO | Admitting: Urology

## 2021-10-09 ENCOUNTER — Encounter: Payer: Self-pay | Admitting: Urology

## 2021-10-09 ENCOUNTER — Other Ambulatory Visit: Payer: Self-pay | Admitting: Urology

## 2021-10-09 ENCOUNTER — Other Ambulatory Visit: Payer: Self-pay

## 2021-10-09 VITALS — BP 114/77 | HR 67 | Ht 65.0 in | Wt 192.0 lb

## 2021-10-09 DIAGNOSIS — N200 Calculus of kidney: Secondary | ICD-10-CM | POA: Diagnosis not present

## 2021-10-09 DIAGNOSIS — R102 Pelvic and perineal pain: Secondary | ICD-10-CM

## 2021-10-09 NOTE — Patient Instructions (Signed)
Lithotripsy Lithotripsy is a treatment that can help break up kidney stones that are too large to pass on their own. This is a nonsurgical procedure that crushes a kidney stone with shock waves. These shock waves pass through your body and focus on the kidney stone. They cause the kidney stone to break up into smaller pieces while it is still in the urinary tract. The smaller pieces of stone can pass more easily out of your body in the urine. Tell a health care provider about: Any allergies you have. All medicines you are taking, including vitamins, herbs, eye drops, creams, and over-the-counter medicines. Any problems you or family members have had with anesthetic medicines. Any blood disorders you have. Any surgeries you have had. Any medical conditions you have. Whether you are pregnant or may be pregnant. What are the risks? Generally, this is a safe procedure. However, problems may occur, including: Infection. Bleeding from the kidney. Bruising of the kidney or skin. Scarring of the kidney, which can lead to: Increased blood pressure. Poor kidney function. Return (recurrence) of kidney stones. Damage to other structures or organs, such as the liver, colon, spleen, or pancreas. Blockage (obstruction) of the tube that carries urine from the kidney to the bladder (ureter). Failure of the kidney stone to break into pieces (fragments). What happens before the procedure? Staying hydrated Follow instructions from your health care provider about hydration, which may include: Up to 2 hours before the procedure - you may continue to drink clear liquids, such as water, clear fruit juice, black coffee, and plain tea. Eating and drinking restrictions Follow instructions from your health care provider about eating and drinking, which may include: 8 hours before the procedure - stop eating heavy meals or foods, such as meat, fried foods, or fatty foods. 6 hours before the procedure - stop eating  light meals or foods, such as toast or cereal. 6 hours before the procedure - stop drinking milk or drinks that contain milk. 2 hours before the procedure - stop drinking clear liquids. Medicines Ask your health care provider about: Changing or stopping your regular medicines. This is especially important if you are taking diabetes medicines or blood thinners. Taking medicines such as aspirin and ibuprofen. These medicines can thin your blood. Do not take these medicines unless your health care provider tells you to take them. Taking over-the-counter medicines, vitamins, herbs, and supplements. Tests You may have tests, such as: Blood tests. Urine tests. Imaging tests, such as a CT scan. General instructions Plan to have someone take you home from the hospital or clinic. If you will be going home right after the procedure, plan to have someone with you for 24 hours. Ask your health care provider what steps will be taken to help prevent infection. These may include washing skin with a germ-killing soap. What happens during the procedure?  An IV will be inserted into one of your veins. You will be given one or more of the following: A medicine to help you relax (sedative). A medicine to make you fall asleep (general anesthetic). A water-filled cushion may be placed behind your kidney or on your abdomen. In some cases, you may be placed in a tub of lukewarm water. Your body will be positioned in a way that makes it easy to target the kidney stone. An X-ray or ultrasound exam will be done to locate your stone. Shock waves will be aimed at the stone. If you are awake, you may feel a tapping sensation as   the shock waves pass through your body. A flexible tube with holes in it (stent) may be placed in the ureter. This will help keep urine flowing from the kidney if the fragments of the stone have been blocking the ureter. The procedure may vary among health care providers and hospitals. What  happens after the procedure? You may have an X-ray to see whether the procedure was able to break up the kidney stone and how much of the stone has passed. If large stone fragments remain after treatment, you may need to have a second procedure at a later time. Your blood pressure, heart rate, breathing rate, and blood oxygen level will be monitored until you leave the hospital or clinic. You may be given antibiotics or pain medicine as needed. If a stent was placed in your ureter during surgery, it may stay in place for a few weeks. You may need to strain your urine to collect pieces of the kidney stone for testing. You will need to drink plenty of water. If you were given a sedative during the procedure, it can affect you for several hours. Do not drive or operate machinery until your health care provider says that it is safe. Summary Lithotripsy is a treatment that can help break up kidney stones that are too large to pass on their own. Lithotripsy is a nonsurgical procedure that crushes a kidney stone with shock waves. Generally, this is a safe procedure. However, problems may occur, including damage to the kidney or other organs, infection, or obstruction of the tube that carries urine from the kidney to the bladder (ureter). You may have a stent placed in your ureter to help drain your urine. This stent may stay in place for a few weeks. After the procedure, you will need to drink plenty of water. You may be asked to strain your urine to collect pieces of the kidney stone for testing. This information is not intended to replace advice given to you by your health care provider. Make sure you discuss any questions you have with your health care provider. Document Revised: 06/28/2019 Document Reviewed: 06/29/2019 Elsevier Patient Education  2022 Elsevier Inc.  Lithotripsy, Care After This sheet gives you information about how to care for yourself after your procedure. Your health care provider  may also give you more specific instructions. If you have problems or questions, contact your health care provider. What can I expect after the procedure? After the procedure, it is common to have: Some blood in your urine. This should only last for a few days. Soreness in your back, sides, or upper abdomen for a few days. Blotches or bruises on the area where the shock wave entered the skin. Pain, discomfort, or nausea when pieces (fragments) of the kidney stone move through the tube that carries urine from the kidney to the bladder (ureter). Stone fragments may pass soon after the procedure, but they may continue to pass for up to 4-8 weeks. If you have severe pain or nausea, contact your health care provider. This may be caused by a large stone that was not broken up, and this may mean that you need more treatment. Some pain or discomfort during urination. Some pain or discomfort in the lower abdomen or (in men) at the base of the penis. Follow these instructions at home: Medicines Take over-the-counter and prescription medicines only as told by your health care provider. If you were prescribed an antibiotic medicine, take it as told by your health care provider.   Do not stop taking the antibiotic even if you start to feel better. Ask your health care provider if the medicine prescribed to you requires you to avoid driving or using machinery. Eating and drinking   Drink enough fluid to keep your urine pale yellow. This helps any remaining pieces of the stone to pass. It can also help prevent new stones from forming. Eat plenty of fresh fruits and vegetables. Follow instructions from your health care provider about eating or drinking restrictions. You may be instructed to: Reduce how much salt (sodium) you eat or drink. Check ingredients and nutrition facts on packaged foods and beverages to see how much sodium they contain. Reduce how much meat you eat. Eat the recommended amount of calcium  for your age and gender. Ask your health care provider how much calcium you should have. General instructions Get plenty of rest. Return to your normal activities as told by your health care provider. Ask your health care provider what activities are safe for you. Most people can resume normal activities 1-2 days after the procedure. If you were given a sedative during the procedure, it can affect you for several hours. Do not drive or operate machinery until your health care provider says that it is safe. Your health care provider may direct you to lie in a certain position (postural drainage) and tap firmly (percuss) over your kidney area to help stone fragments pass. Follow instructions as told by your health care provider. If directed, strain all urine through the strainer that was provided by your health care provider. Keep all fragments for your health care provider to see. Any stones that are found may be sent to a medical lab for examination. The stone may be as small as a grain of salt. Keep all follow-up visits as told by your health care provider. This is important. Contact a health care provider if: You have a fever or chills. You have nausea that is severe or does not go away. You have any of these urinary symptoms: Blood in your urine for longer than your health care provider told you to expect. Urine that smells bad or unusual. Feeling a strong urge to urinate after emptying your bladder. Pain or burning with urination that does not go away. Urinating more often than usual and this does not go away. You have a stent and it comes out. Get help right away if: You have severe pain in your back, sides, or upper abdomen. You have any of these urinary symptoms: Severe pain while urinating. More blood in your urine or having blood in your urine when you did not before. Passing blood clots in your urine. Passing only a small amount of urine or being unable to pass any urine at  all. You have severe nausea that leads to persistent vomiting. You faint. Summary After this procedure, it is common to have some pain, discomfort, or nausea when pieces (fragments) of the kidney stone move through the tube that carries urine from the kidney to the bladder (ureter). If this pain or nausea is severe, however, you should contact your health care provider. Return to your normal activities as told by your health care provider. Ask your health care provider what activities are safe for you. Drink enough fluid to keep your urine pale yellow. This helps any remaining pieces of the stone to pass, and it can help prevent new stones from forming. If directed, strain your urine and keep all fragments for your health care provider   to see. Fragments or stones may be as small as a grain of salt. Get help right away if you have severe pain in your back, sides, or upper abdomen, or if you have severe pain while urinating. This information is not intended to replace advice given to you by your health care provider. Make sure you discuss any questions you have with your health care provider. Document Revised: 05/20/2021 Document Reviewed: 05/20/2021 Elsevier Patient Education  2022 Elsevier Inc.  

## 2021-10-09 NOTE — Progress Notes (Signed)
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  Culture was ultimately negative.  She had some hematuria at that time that resolved after antibiotics and was associated with UTI.  She has had some lower abdominal and bilateral low back pain over the last month, that seems to be worse with certain positions and improved with stretching and heating pad.  She denies any prior history of stones.  She denies any renal colic.  She also recently had a Bartholin cyst that was treated with doxycycline and resolved.  A CT was ordered on 10/03/2021 by her PCP for her persistent back pain, and showed a 5 mm nonobstructing right mid pole renal stone  Urinalysis today pending    PMH: Past Medical History:  Diagnosis Date   COVID-19    10/23/20   Flu    2022   Sinusitis    07/29/21 sinusitis + flu seen tx'ed urgent care tamiflu and augmentin   Vitiligo    arms, legs FH vitiligo    Surgical History: Past Surgical History:  Procedure Laterality Date   ABDOMINAL HYSTERECTOMY     for endometriosis no h/o abnormal total ovaries uterus,cervix out    BREAST REDUCTION SURGERY     2001   CESAREAN SECTION     2000   CHOLECYSTECTOMY     REDUCTION MAMMAPLASTY Bilateral 2001    Family History: Family History  Problem Relation Age of Onset   Heart disease Mother    Thyroid disease Mother    Heart disease Father    Hearing loss Father    Stroke Father    Kidney Stones Father    Birth defects Maternal Grandfather    Heart disease Maternal Grandfather    Stroke Maternal Grandfather    Birth defects Paternal Grandmother    Heart disease Paternal Grandfather    Kidney Stones Son    Breast cancer Neg Hx     Social History:  reports that she has never smoked. She has never used smokeless tobacco. She reports that she does not currently use  alcohol. She reports that she does not use drugs.  Physical Exam: BP 114/77 (BP Location: Left Arm, Patient Position: Sitting, Cuff Size: Large)    Pulse 67    Ht 5\' 5"  (1.651 m)    Wt 192 lb (87.1 kg)    BMI 31.95 kg/m    Constitutional:  Alert and oriented, No acute distress. Cardiovascular: No clubbing, cyanosis, or edema. Respiratory: Normal respiratory effort, no increased work of breathing. GI: Abdomen is soft, nontender, nondistended, no abdominal masses   Laboratory Data: Reviewed, see HPI  Pertinent Imaging: I have personally viewed and interpreted the CT 10/03/2021 showing a 5 mm right midpole stone, 950HU, clearly seen on scout CT, 7 cm SSD, no hydronephrosis, no other stones, no other etiology of abdominal/low back pain.  Assessment & Plan:   56 year old female with bilateral low back pain that sounds more musculoskeletal in nature, who was found to have a 5 mm right midpole stone on recent CT  We discussed various treatment options for urolithiasis including observation with or without medical expulsive therapy, shockwave lithotripsy (SWL), ureteroscopy and laser lithotripsy with stent placement, and percutaneous nephrolithotomy.  We discussed that management is based on stone size, location, density, patient co-morbidities, and patient preference.   Stones <89mm in size  have a >80% spontaneous passage rate. Data surrounding the use of tamsulosin for medical expulsive therapy is controversial, but meta analyses suggests it is most efficacious for distal stones between 5-45mm in size. Possible side effects include dizziness/lightheadedness, and retrograde ejaculation.  SWL has a lower stone free rate in a single procedure, but also a lower complication rate compared to ureteroscopy and avoids a stent and associated stent related symptoms. Possible complications include renal hematoma, steinstrasse, and need for additional treatment.  Ureteroscopy with laser lithotripsy and stent  placement has a higher stone free rate than SWL in a single procedure, however increased complication rate including possible infection, ureteral injury, bleeding, and stent related morbidity. Common stent related symptoms include dysuria, urgency/frequency, and flank pain.  Using shared decision making, she opted for right shockwave lithotripsy.  We discussed at length that this may not improve her bilateral low back pain if that is musculoskeletal in nature  Nickolas Madrid, MD 10/09/2021  [facility] [address] [place], [place] [address] [phone]

## 2021-10-09 NOTE — Telephone Encounter (Signed)
I spoke with Candace Joseph. We have discussed possible surgery dates and Thursday January 19th, 2023 was agreed upon by all parties. Patient given information about surgery date, what to expect pre-operatively and post operatively.   We discussed that a Pre-Admission Testing office will be calling to set up the pre-op visit that will take place prior to surgery, and that these appointments are typically done over the phone with a Pre-Admissions RN.   Informed patient that our office will communicate any additional care to be provided after surgery. Patients questions or concerns were discussed during our call. Advised to call our office should there be any additional information, questions or concerns that arise. Patient verbalized understanding.

## 2021-10-09 NOTE — H&P (View-Only) (Signed)
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  Culture was ultimately negative.  She had some hematuria at that time that resolved after antibiotics and was associated with UTI.  She has had some lower abdominal and bilateral low back pain over the last month, that seems to be worse with certain positions and improved with stretching and heating pad.  She denies any prior history of stones.  She denies any renal colic.  She also recently had a Bartholin cyst that was treated with doxycycline and resolved.  A CT was ordered on 10/03/2021 by her PCP for her persistent back pain, and showed a 5 mm nonobstructing right mid pole renal stone  Urinalysis today pending    PMH: Past Medical History:  Diagnosis Date   COVID-19    10/23/20   Flu    2022   Sinusitis    07/29/21 sinusitis + flu seen tx'ed urgent care tamiflu and augmentin   Vitiligo    arms, legs FH vitiligo    Surgical History: Past Surgical History:  Procedure Laterality Date   ABDOMINAL HYSTERECTOMY     for endometriosis no h/o abnormal total ovaries uterus,cervix out    BREAST REDUCTION SURGERY     2001   CESAREAN SECTION     2000   CHOLECYSTECTOMY     REDUCTION MAMMAPLASTY Bilateral 2001    Family History: Family History  Problem Relation Age of Onset   Heart disease Mother    Thyroid disease Mother    Heart disease Father    Hearing loss Father    Stroke Father    Kidney Stones Father    Birth defects Maternal Grandfather    Heart disease Maternal Grandfather    Stroke Maternal Grandfather    Birth defects Paternal Grandmother    Heart disease Paternal Grandfather    Kidney Stones Son    Breast cancer Neg Hx     Social History:  reports that she has never smoked. She has never used smokeless tobacco. She reports that she does not currently use  alcohol. She reports that she does not use drugs.  Physical Exam: BP 114/77 (BP Location: Left Arm, Patient Position: Sitting, Cuff Size: Large)    Pulse 67    Ht 5\' 5"  (1.651 m)    Wt 192 lb (87.1 kg)    BMI 31.95 kg/m    Constitutional:  Alert and oriented, No acute distress. Cardiovascular: No clubbing, cyanosis, or edema. Respiratory: Normal respiratory effort, no increased work of breathing. GI: Abdomen is soft, nontender, nondistended, no abdominal masses   Laboratory Data: Reviewed, see HPI  Pertinent Imaging: I have personally viewed and interpreted the CT 10/03/2021 showing a 5 mm right midpole stone, 950HU, clearly seen on scout CT, 7 cm SSD, no hydronephrosis, no other stones, no other etiology of abdominal/low back pain.  Assessment & Plan:   56 year old female with bilateral low back pain that sounds more musculoskeletal in nature, who was found to have a 5 mm right midpole stone on recent CT  We discussed various treatment options for urolithiasis including observation with or without medical expulsive therapy, shockwave lithotripsy (SWL), ureteroscopy and laser lithotripsy with stent placement, and percutaneous nephrolithotomy.  We discussed that management is based on stone size, location, density, patient co-morbidities, and patient preference.   Stones <98mm in size  have a >80% spontaneous passage rate. Data surrounding the use of tamsulosin for medical expulsive therapy is controversial, but meta analyses suggests it is most efficacious for distal stones between 5-31mm in size. Possible side effects include dizziness/lightheadedness, and retrograde ejaculation.  SWL has a lower stone free rate in a single procedure, but also a lower complication rate compared to ureteroscopy and avoids a stent and associated stent related symptoms. Possible complications include renal hematoma, steinstrasse, and need for additional treatment.  Ureteroscopy with laser lithotripsy and stent  placement has a higher stone free rate than SWL in a single procedure, however increased complication rate including possible infection, ureteral injury, bleeding, and stent related morbidity. Common stent related symptoms include dysuria, urgency/frequency, and flank pain.  Using shared decision making, she opted for right shockwave lithotripsy.  We discussed at length that this may not improve her bilateral low back pain if that is musculoskeletal in nature  Nickolas Madrid, MD 10/09/2021  [facility] [address] [place], [place] [address] [phone]

## 2021-10-09 NOTE — Progress Notes (Signed)
ESWL ORDER FORM  Expected date of procedure: Patient preference  Surgeon:  MD on truck  Post op standing: 2-4wk follow up w/KUB prior  Anticoagulation/Aspirin/NSAID standing order: Hold all 72 hours prior  Anesthesia standing order: MAC  VTE standing: SCD's  Dx: Right Nephrolihtiasis  Procedure: right Extracorporeal shock wave lithotripsy  CPT : 30076  Standing Order Set:   *NPO after mn, KUB  *NS 160m/hr, Keflex 5018mPO, Benadryl 2536mO, Valium 78m48m, Zofran 4mg 72m   Medications if other than standing orders:   NONE

## 2021-10-10 LAB — URINALYSIS, COMPLETE
Bilirubin, UA: NEGATIVE
Glucose, UA: NEGATIVE
Ketones, UA: NEGATIVE
Leukocytes,UA: NEGATIVE
Nitrite, UA: NEGATIVE
Protein,UA: NEGATIVE
RBC, UA: NEGATIVE
Specific Gravity, UA: 1.01 (ref 1.005–1.030)
Urobilinogen, Ur: 0.2 mg/dL (ref 0.2–1.0)
pH, UA: 6.5 (ref 5.0–7.5)

## 2021-10-10 LAB — MICROSCOPIC EXAMINATION
Bacteria, UA: NONE SEEN
Epithelial Cells (non renal): NONE SEEN /hpf (ref 0–10)
RBC, Urine: NONE SEEN /hpf (ref 0–2)

## 2021-10-13 LAB — CULTURE, URINE COMPREHENSIVE

## 2021-10-16 MED ORDER — SODIUM CHLORIDE 0.9 % IV SOLN: INTRAVENOUS | Status: DC

## 2021-10-16 MED ORDER — ONDANSETRON HCL 4 MG/2ML IJ SOLN: 4.0000 mg | Freq: Once | INTRAMUSCULAR | Status: AC

## 2021-10-16 MED ORDER — DIPHENHYDRAMINE HCL 25 MG PO CAPS: 25.0000 mg | ORAL_CAPSULE | ORAL | Status: AC

## 2021-10-16 MED ORDER — CEPHALEXIN 500 MG PO CAPS
500.0000 mg | ORAL_CAPSULE | Freq: Once | ORAL | Status: AC
  Administered 2021-10-17: 500 mg via ORAL

## 2021-10-16 MED ORDER — DIAZEPAM 5 MG PO TABS
10.0000 mg | ORAL_TABLET | ORAL | Status: AC
  Administered 2021-10-17: 10 mg via ORAL

## 2021-10-17 ENCOUNTER — Encounter
Admission: RE | Disposition: A | Discharge status: Home / Self Care | Payer: Self-pay | Source: Home / Self Care | Attending: Urology

## 2021-10-17 ENCOUNTER — Ambulatory Visit: Payer: BC Managed Care – PPO

## 2021-10-17 ENCOUNTER — Encounter: Payer: Self-pay | Admitting: Urology

## 2021-10-17 ENCOUNTER — Other Ambulatory Visit: Payer: Self-pay

## 2021-10-17 ENCOUNTER — Ambulatory Visit
Admission: RE | Admit: 2021-10-17 | Discharge: 2021-10-17 | Disposition: A | Discharge status: Home / Self Care | Payer: BC Managed Care – PPO | Attending: Urology | Admitting: Urology

## 2021-10-17 DIAGNOSIS — E669 Obesity, unspecified: Secondary | ICD-10-CM | POA: Diagnosis not present

## 2021-10-17 DIAGNOSIS — N200 Calculus of kidney: Secondary | ICD-10-CM | POA: Diagnosis present

## 2021-10-17 DIAGNOSIS — Z6831 Body mass index (BMI) 31.0-31.9, adult: Secondary | ICD-10-CM | POA: Diagnosis not present

## 2021-10-17 HISTORY — PX: EXTRACORPOREAL SHOCK WAVE LITHOTRIPSY: SHX1557

## 2021-10-17 HISTORY — DX: Nausea with vomiting, unspecified: R11.2

## 2021-10-17 HISTORY — DX: Other specified postprocedural states: Z98.890

## 2021-10-17 SURGERY — LITHOTRIPSY, ESWL
Anesthesia: Monitor Anesthesia Care | Laterality: Right

## 2021-10-17 MED ORDER — ONDANSETRON HCL 4 MG/2ML IJ SOLN
INTRAMUSCULAR | Status: AC
  Administered 2021-10-17: 4 mg via INTRAVENOUS
  Filled 2021-10-17: qty 2

## 2021-10-17 MED ORDER — CEPHALEXIN 500 MG PO CAPS
ORAL_CAPSULE | ORAL | Status: AC
  Filled 2021-10-17: qty 1

## 2021-10-17 MED ORDER — DIPHENHYDRAMINE HCL 25 MG PO CAPS
ORAL_CAPSULE | ORAL | Status: AC
  Administered 2021-10-17: 25 mg via ORAL
  Filled 2021-10-17: qty 1

## 2021-10-17 MED ORDER — HYDROCODONE-ACETAMINOPHEN 5-325 MG PO TABS
1.0000 | ORAL_TABLET | Freq: Four times a day (QID) | ORAL | 0 refills | Status: DC | PRN
Start: 2021-10-17 — End: 2022-02-14

## 2021-10-17 MED ORDER — DIAZEPAM 5 MG PO TABS
ORAL_TABLET | ORAL | Status: AC
  Filled 2021-10-17: qty 2

## 2021-10-17 MED ORDER — TAMSULOSIN HCL 0.4 MG PO CAPS: 0.4000 mg | ORAL_CAPSULE | Freq: Every day | ORAL | 0 refills | Status: DC

## 2021-10-17 NOTE — Discharge Instructions (Addendum)
As per the [facility] discharge instructions A prescription for pain medication was sent to your pharmacy A prescription for tamsulosin was also sent which will help you pass stone fragments Call our office [phone] for fever greater than 101 degrees or pain not controlled with oral medication You have a follow-up appointment scheduled 11/14/2021 AMBULATORY SURGERY  DISCHARGE INSTRUCTIONS   The drugs that you were given will stay in your system until tomorrow so for the next 24 hours you should not:  Drive an automobile Make any legal decisions Drink any alcoholic beverage   You may resume regular meals tomorrow.  Today it is better to start with liquids and gradually work up to solid foods.  You may eat anything you prefer, but it is better to start with liquids, then soup and crackers, and gradually work up to solid foods.   Please notify your doctor immediately if you have any unusual bleeding, trouble breathing, redness and pain at the surgery site, drainage, fever, or pain not relieved by medication.    Additional Instructions:    Please contact your physician with any problems or Same Day Surgery at [phone], Monday through Friday 6 am to 4 pm, or [place] [facility] number at [phone].

## 2021-10-17 NOTE — Interval H&P Note (Signed)
History and Physical Interval Note:  CV:RRR Lungs:clear  10/17/2021 7:42 AM  Candace Joseph  has presented today for surgery, with the diagnosis of Right Nephrolithiasis.  The various methods of treatment have been discussed with the patient and family. After consideration of risks, benefits and other options for treatment, the patient has consented to  Procedure(s): EXTRACORPOREAL SHOCK WAVE LITHOTRIPSY (ESWL) (Right) as a surgical intervention.  The patient's history has been reviewed, patient examined, no change in status, stable for surgery.  I have reviewed the patient's chart and labs.  Questions were answered to the patient's satisfaction.     Katelynn Heidler C Yilin Weedon

## 2021-11-11 ENCOUNTER — Ambulatory Visit
Admission: RE | Admit: 2021-11-11 | Discharge: 2021-11-11 | Disposition: A | Discharge status: Home / Self Care | Payer: BC Managed Care – PPO | Source: Ambulatory Visit | Attending: Urology | Admitting: Urology

## 2021-11-11 DIAGNOSIS — N200 Calculus of kidney: Secondary | ICD-10-CM | POA: Insufficient documentation

## 2021-11-14 ENCOUNTER — Encounter: Payer: Self-pay | Admitting: Urology

## 2021-11-14 ENCOUNTER — Other Ambulatory Visit: Payer: Self-pay

## 2021-11-14 ENCOUNTER — Ambulatory Visit (INDEPENDENT_AMBULATORY_CARE_PROVIDER_SITE_OTHER): Payer: BC Managed Care – PPO | Admitting: Urology

## 2021-11-14 VITALS — BP 114/78 | HR 68 | Ht 65.0 in | Wt 192.0 lb

## 2021-11-14 DIAGNOSIS — N200 Calculus of kidney: Secondary | ICD-10-CM

## 2021-11-14 NOTE — Progress Notes (Signed)
11/14/2021 4:20 PM   Candace Joseph [birth year] [id number]  Referring provider: McLean-Scocuzza, Pasty Spillers, MD [address] [place],  [place] [address]  Chief Complaint  Patient presents with   Nephrolithiasis    HPI: Candace Joseph is a 56 y.o. who is status post ESWL who presents today for follow up.  Underwent ESWL on 10/17/2021 for right renal stone with Dr. Lonna Cobb.  Their postprocedural course was as expected and uneventful.   They have not passed fragments.    They bring in fragments for analysis.   KUB right renal stone no longer visualized    PMH: Past Medical History:  Diagnosis Date   COVID-19    10/23/20   Flu    2022   PONV (postoperative nausea and vomiting)    Sinusitis    07/29/21 sinusitis + flu seen tx'ed urgent care tamiflu and augmentin   Vitiligo    arms, legs FH vitiligo    Surgical History: Past Surgical History:  Procedure Laterality Date   ABDOMINAL HYSTERECTOMY     for endometriosis no h/o abnormal total ovaries uterus,cervix out    BREAST REDUCTION SURGERY     2001   CESAREAN SECTION     2000   CHOLECYSTECTOMY     EXTRACORPOREAL SHOCK WAVE LITHOTRIPSY Right 10/17/2021   Procedure: EXTRACORPOREAL SHOCK WAVE LITHOTRIPSY (ESWL);  Surgeon: Riki Altes, MD;  Location: [facility] OR[facility];  Service: Urology;  Laterality: Right;   REDUCTION MAMMAPLASTY Bilateral 2001    Home Medications:  Current Outpatient Medications on File Prior to Visit  Medication Sig Dispense Refill   cetirizine (ZYRTEC) 10 MG tablet Take 1 tablet (10 mg total) by mouth daily as needed for allergies. 90 tablet 3   Probiotic Product (PROBIOTIC PO) Take by mouth in the morning and at bedtime. Plexus     HYDROcodone-acetaminophen (NORCO/VICODIN) 5-325 MG tablet Take 1 tablet by mouth every 6 (six) hours as needed for moderate pain. (Patient not taking: Reported on 11/14/2021) 10 tablet 0   tamsulosin (FLOMAX) 0.4 MG CAPS capsule Take 1 capsule (0.4 mg total) by mouth daily after  breakfast. (Patient not taking: Reported on 11/14/2021) 14 capsule 0   No current facility-administered medications on file prior to visit.    Allergies: No Known Allergies  Family History: Family History  Problem Relation Age of Onset   Heart disease Mother    Thyroid disease Mother    Heart disease Father    Hearing loss Father    Stroke Father    Kidney Stones Father    Birth defects Maternal Grandfather    Heart disease Maternal Grandfather    Stroke Maternal Grandfather    Birth defects Paternal Grandmother    Heart disease Paternal Grandfather    Kidney Stones Son    Breast cancer Neg Hx     Social History:  reports that she has never smoked. She has never used smokeless tobacco. She reports that she does not currently use alcohol. She reports that she does not use drugs.  ROS: Pertinent ROS in HPI  Physical Exam: BP 114/78    Pulse 68    Ht 5\' 5"  (1.651 m)    Wt 192 lb (87.1 kg)    BMI 31.95 kg/m   Constitutional:  Well nourished. Alert and oriented, No acute distress. HEENT: [place] AT, mask in place.   Trachea midline. Cardiovascular: No clubbing, cyanosis, or edema. Respiratory: Normal respiratory effort, no increased work of breathing. Neurologic: Grossly intact, no focal deficits, moving  all 4 extremities. Psychiatric: Normal mood and affect.  Laboratory Data: Lab Results  Component Value Date   WBC 7.8 09/09/2021   HGB 12.4 09/09/2021   HCT 37.8 09/09/2021   MCV 90.6 09/09/2021   PLT 286.0 09/09/2021    Lab Results  Component Value Date   CREATININE 0.83 09/09/2021       Component Value Date/Time   CHOL 197 03/29/2021 0906   HDL 64.90 03/29/2021 0906   CHOLHDL 3 03/29/2021 0906   VLDL 24.6 03/29/2021 0906   LDLCALC 107 (H) 03/29/2021 0906   LDLCALC 90 03/28/2020 0952    Urinalysis    Component Value Date/Time   COLORURINE YELLOW 09/09/2021 1624   APPEARANCEUR Clear 10/09/2021 1435   LABSPEC 1.010 09/09/2021 1624   PHURINE 6.0 09/09/2021  1624   GLUCOSEU Negative 10/09/2021 1435   GLUCOSEU NEGATIVE 09/09/2021 1624   HGBUR NEGATIVE 09/09/2021 1624   BILIRUBINUR Negative 10/09/2021 1435   KETONESUR NEGATIVE 09/09/2021 1624   PROTEINUR Negative 10/09/2021 1435   PROTEINUR 1+ (A) 09/06/2021 1550   UROBILINOGEN 0.2 09/09/2021 1640   UROBILINOGEN 0.2 09/09/2021 1624   NITRITE Negative 10/09/2021 1435   NITRITE NEGATIVE 09/09/2021 1624   LEUKOCYTESUR Negative 10/09/2021 1435   LEUKOCYTESUR NEGATIVE 09/09/2021 1624    I have reviewed the labs.   Pertinent Imaging: CLINICAL DATA:  Post chest wall.  Right nephrolithiasis.   EXAM: ABDOMEN - 1 VIEW   COMPARISON:  Abdomen 10/17/2021.  CT 10/03/2021.   FINDINGS: Large amount of stool noted throughout the colon making evaluation for stone disease difficult. Right renal stone no longer visualized. Lower abdominal and pelvic calcifications consistent with phleboliths as previously described on prior CT again noted. No interim change. Surgical clips noted over the upper abdomen. Stool noted throughout the colon. No bowel distention. Degenerative changes lumbar spine and both hips.   IMPRESSION: Large amount of stool noted throughout the colon making evaluation for stone disease difficult. Right renal stone no longer visualized. Lower abdominal and pelvic calcifications consistent phleboliths as previously described on prior CT again noted. No interim change.     Electronically Signed   By: Maisie Fus  Register M.D.   On: 11/12/2021 09:20 I have independently reviewed the films.    Assessment & Plan:    1. Right renal stone - stone sent for analysis  -Offered 24-hour metabolic work-up as she has a strong family history of nephrolithiasis, but she declined -Did give her a booklet on the ABCs of stone prevention    Return in about 6 months (around 05/14/2022) for KUB and office visit .  These notes generated with voice recognition software. I apologize for  typographical errors.  Michiel Cowboy, PA-C  [facility] [address]  [address] [place], [place] [address] [phone]

## 2021-11-18 LAB — CALCULI, WITH PHOTOGRAPH (CLINICAL LAB)
Calcium Oxalate Dihydrate: 20 %
Calcium Oxalate Monohydrate: 75 %
Hydroxyapatite: 5 %
Size Calculi: <1 mm
Weight Calculi: <1 mg

## 2021-11-25 ENCOUNTER — Other Ambulatory Visit: Payer: Self-pay

## 2021-11-25 ENCOUNTER — Ambulatory Visit
Admission: RE | Admit: 2021-11-25 | Discharge: 2021-11-25 | Disposition: A | Discharge status: Home / Self Care | Payer: BC Managed Care – PPO | Source: Ambulatory Visit | Attending: Internal Medicine | Admitting: Internal Medicine

## 2021-11-25 DIAGNOSIS — Z1231 Encounter for screening mammogram for malignant neoplasm of breast: Secondary | ICD-10-CM | POA: Insufficient documentation

## 2022-02-05 ENCOUNTER — Ambulatory Visit
Admission: EM | Admit: 2022-02-05 | Discharge: 2022-02-05 | Disposition: A | Discharge status: Home / Self Care | Payer: BC Managed Care – PPO | Attending: Emergency Medicine | Admitting: Emergency Medicine

## 2022-02-05 DIAGNOSIS — R300 Dysuria: Secondary | ICD-10-CM | POA: Insufficient documentation

## 2022-02-05 LAB — POCT URINALYSIS DIP (MANUAL ENTRY)
Bilirubin, UA: NEGATIVE
Blood, UA: NEGATIVE
Glucose, UA: NEGATIVE mg/dL
Ketones, POC UA: NEGATIVE mg/dL
Nitrite, UA: NEGATIVE
Protein Ur, POC: NEGATIVE mg/dL
Spec Grav, UA: 1.01 (ref 1.010–1.025)
Urobilinogen, UA: 0.2 E.U./dL
pH, UA: 7 (ref 5.0–8.0)

## 2022-02-05 MED ORDER — CEPHALEXIN 500 MG PO CAPS: 500.0000 mg | ORAL_CAPSULE | Freq: Two times a day (BID) | ORAL | 0 refills | Status: AC

## 2022-02-05 NOTE — ED Triage Notes (Signed)
Patient presents to Urgent Care with complaints of dysuria, odor, urinary frequency since Thursday night. Treating symptoms with AZO.  ? ?Denies fever or hematuria.   ?

## 2022-02-05 NOTE — ED Provider Notes (Signed)
[name] ? ? ? ?CSN: [id number] ?Arrival date & time: 02/05/22  1540 ? ? ?  ? ?History   ?Chief Complaint ?Chief Complaint  ?Patient presents with  ? Dysuria  ? Flank Pain  ? Urinary Frequency  ? ? ?HPI ?Candace Joseph is a 56 y.o. female.  Patient presents with 6-day history of dysuria, urinary frequency, malodorous urine.  No fever, rash, abdominal pain, flank pain, vaginal discharge, pelvic pain, or other symptoms.  Treating symptoms at home with Azo but she has been accidentally using the Azo for yeast infection. ? ?The history is provided by the patient and medical records.  ? ?Past Medical History:  ?Diagnosis Date  ? COVID-19   ? 10/23/20  ? Flu   ? 2022  ? PONV (postoperative nausea and vomiting)   ? Sinusitis   ? 07/29/21 sinusitis + flu seen tx'ed urgent care tamiflu and augmentin  ? Vitiligo   ? arms, legs FH vitiligo  ? ? ?Patient Active Problem List  ? Diagnosis Date Noted  ? Right kidney stone 10/04/2021  ? Vitamin D deficiency 04/01/2021  ? Well adult exam 03/27/2020  ? Skin lesion 08/13/2018  ? Vitiligo 08/13/2018  ? ? ?Past Surgical History:  ?Procedure Laterality Date  ? ABDOMINAL HYSTERECTOMY    ? for endometriosis no h/o abnormal total ovaries uterus,cervix out   ? BREAST REDUCTION SURGERY    ? 2001  ? CESAREAN SECTION    ? 2000  ? CHOLECYSTECTOMY    ? EXTRACORPOREAL SHOCK WAVE LITHOTRIPSY Right 10/17/2021  ? Procedure: EXTRACORPOREAL SHOCK WAVE LITHOTRIPSY (ESWL);  Surgeon: Riki Altes, MD;  Location: [facility];  Service: Urology;  Laterality: Right;  ? REDUCTION MAMMAPLASTY Bilateral 2001  ? ? ?OB History   ?No obstetric history on file. ?  ? ? ? ?Home Medications   ? ?Prior to Admission medications   ?Medication Sig Start Date End Date Taking? Authorizing Provider  ?cephALEXin (KEFLEX) 500 MG capsule Take 1 capsule (500 mg total) by mouth 2 (two) times daily for 5 days. 02/05/22 02/10/22 Yes Mickie Bail, NP  ?cetirizine (ZYRTEC) 10 MG tablet Take 1 tablet (10 mg total) by  mouth daily as needed for allergies. 10/25/20   McLean-Scocuzza, Pasty Spillers, MD  ?HYDROcodone-acetaminophen (NORCO/VICODIN) 5-325 MG tablet Take 1 tablet by mouth every 6 (six) hours as needed for moderate pain. ?Patient not taking: Reported on 11/14/2021 10/17/21   Riki Altes, MD  ?Probiotic Product (PROBIOTIC PO) Take by mouth in the morning and at bedtime. Plexus    [provider]  ?tamsulosin (FLOMAX) 0.4 MG CAPS capsule Take 1 capsule (0.4 mg total) by mouth daily after breakfast. ?Patient not taking: Reported on 11/14/2021 10/17/21   Riki Altes, MD  ? ? ?Family History ?Family History  ?Problem Relation Age of Onset  ? Heart disease Mother   ? Thyroid disease Mother   ? Heart disease Father   ? Hearing loss Father   ? Stroke Father   ? Kidney Stones Father   ? Birth defects Maternal Grandfather   ? Heart disease Maternal Grandfather   ? Stroke Maternal Grandfather   ? Birth defects Paternal Grandmother   ? Heart disease Paternal Grandfather   ? Kidney Stones Son   ? Breast cancer Neg Hx   ? ? ?Social History ?Social History  ? ?Tobacco Use  ? Smoking status: Never  ? Smokeless tobacco: Never  ?Substance Use Topics  ? Alcohol use: Not Currently  ?  Drug use: Never  ? ? ? ?Allergies   ?Patient has no known allergies. ? ? ?Review of Systems ?Review of Systems  ?Constitutional:  Negative for chills and fever.  ?Gastrointestinal:  Negative for abdominal pain.  ?Genitourinary:  Positive for dysuria and frequency. Negative for flank pain, hematuria, pelvic pain and vaginal discharge.  ?Skin:  Negative for color change and rash.  ?All other systems reviewed and are negative. ? ? ?Physical Exam ?Triage Vital Signs ?ED Triage Vitals  ?Enc Vitals Group  ?   BP   ?   Pulse   ?   Resp   ?   Temp   ?   Temp src   ?   SpO2   ?   Weight   ?   Height   ?   Head Circumference   ?   Peak Flow   ?   Pain Score   ?   Pain Loc   ?   Pain Edu?   ?   Excl. in GC?   ? ?No data found. ? ?Updated Vital Signs ?BP 126/86 (BP  Location: Right Arm)   Pulse 65   Temp 98.2 ?F (36.8 ?C) (Temporal)   Resp 16   SpO2 97%  ? ?Visual Acuity ?Right Eye Distance:   ?Left Eye Distance:   ?Bilateral Distance:   ? ?Right Eye Near:   ?Left Eye Near:    ?Bilateral Near:    ? ?Physical Exam ?Vitals and nursing note reviewed.  ?Constitutional:   ?   General: She is not in acute distress. ?   Appearance: Normal appearance. She is well-developed. She is not ill-appearing.  ?HENT:  ?   Mouth/Throat:  ?   Mouth: Mucous membranes are moist.  ?Cardiovascular:  ?   Rate and Rhythm: Normal rate and regular rhythm.  ?   Heart sounds: Normal heart sounds.  ?Pulmonary:  ?   Effort: Pulmonary effort is normal. No respiratory distress.  ?   Breath sounds: Normal breath sounds.  ?Abdominal:  ?   Palpations: Abdomen is soft.  ?   Tenderness: There is no abdominal tenderness. There is no right CVA tenderness, left CVA tenderness, guarding or rebound.  ?Musculoskeletal:  ?   Cervical back: Neck supple.  ?Skin: ?   General: Skin is warm and dry.  ?Neurological:  ?   Mental Status: She is alert.  ?Psychiatric:     ?   Mood and Affect: Mood normal.     ?   Behavior: Behavior normal.  ? ? ? ?UC Treatments / Results  ?Labs ?(all labs ordered are listed, but only abnormal results are displayed) ?Labs Reviewed  ?POCT URINALYSIS DIP (MANUAL ENTRY) - Abnormal; Notable for the following components:  ?    Result Value  ? Leukocytes, UA Small (1+) (*)   ? All other components within normal limits  ?URINE CULTURE  ? ? ?EKG ? ? ?Radiology ?No results found. ? ?Procedures ?Procedures (including critical care time) ? ?Medications Ordered in UC ?Medications - No data to display ? ?Initial Impression / Assessment and Plan / UC Course  ?I have reviewed the triage vital signs and the nursing notes. ? ?Pertinent labs & imaging results that were available during my care of the patient were reviewed by me and considered in my medical decision making (see chart for details). ? ?  ?Dysuria.   Treating with Keflex. Urine culture pending. Discussed with patient that we will call her if the urine  culture shows the need to change or discontinue the antibiotic. Instructed her to follow-up with her PCP if her symptoms are not improving. Patient agrees to plan of care.    ? ?Final Clinical Impressions(s) / UC Diagnoses  ? ?Final diagnoses:  ?Dysuria  ? ? ? ?Discharge Instructions   ? ?  ?Take the antibiotic as directed.  The urine culture is pending.  We will call you if it shows the need to change or discontinue your antibiotic.   ? ?Follow up with your primary care provider if your symptoms are not improving.   ? ? ? ? ? ?ED Prescriptions   ? ? Medication Sig Dispense Auth. Provider  ? cephALEXin (KEFLEX) 500 MG capsule Take 1 capsule (500 mg total) by mouth 2 (two) times daily for 5 days. 10 capsule Mickie Bailate, Renley Banwart H, NP  ? ?  ? ?PDMP not reviewed this encounter. ?  ?Mickie Bailate, Zoey Bidwell H, NP ?02/05/22 1624 ? ?

## 2022-02-05 NOTE — Discharge Instructions (Addendum)
Take the antibiotic as directed.  The urine culture is pending.  We will call you if it shows the need to change or discontinue your antibiotic.    Follow up with your primary care provider if your symptoms are not improving.    

## 2022-02-08 LAB — URINE CULTURE: Culture: 10000 — AB

## 2022-02-10 ENCOUNTER — Telehealth (HOSPITAL_COMMUNITY): Payer: Self-pay | Admitting: Emergency Medicine

## 2022-02-10 MED ORDER — NITROFURANTOIN MONOHYD MACRO 100 MG PO CAPS: 100.0000 mg | ORAL_CAPSULE | Freq: Two times a day (BID) | ORAL | 0 refills | Status: DC

## 2022-02-14 ENCOUNTER — Ambulatory Visit: Payer: BC Managed Care – PPO | Admitting: Internal Medicine

## 2022-02-14 ENCOUNTER — Encounter: Payer: Self-pay | Admitting: Internal Medicine

## 2022-02-14 VITALS — BP 120/80 | HR 61 | Temp 98.1°F | Resp 14 | Ht 65.0 in | Wt 191.8 lb

## 2022-02-14 DIAGNOSIS — Z1231 Encounter for screening mammogram for malignant neoplasm of breast: Secondary | ICD-10-CM | POA: Diagnosis not present

## 2022-02-14 DIAGNOSIS — U071 COVID-19: Secondary | ICD-10-CM

## 2022-02-14 DIAGNOSIS — E2839 Other primary ovarian failure: Secondary | ICD-10-CM | POA: Diagnosis not present

## 2022-02-14 DIAGNOSIS — N390 Urinary tract infection, site not specified: Secondary | ICD-10-CM | POA: Diagnosis not present

## 2022-02-14 DIAGNOSIS — H9193 Unspecified hearing loss, bilateral: Secondary | ICD-10-CM | POA: Diagnosis not present

## 2022-02-14 DIAGNOSIS — J00 Acute nasopharyngitis [common cold]: Secondary | ICD-10-CM

## 2022-02-14 DIAGNOSIS — J0110 Acute frontal sinusitis, unspecified: Secondary | ICD-10-CM

## 2022-02-14 MED ORDER — CETIRIZINE HCL 10 MG PO TABS: 10.0000 mg | ORAL_TABLET | Freq: Every day | ORAL | 3 refills | Status: DC | PRN

## 2022-02-14 NOTE — Progress Notes (Signed)
Chief Complaint  Patient presents with   Personal Problem    Disc personal issues   Urinary Tract Infection    Was seen at [facility] 5/10 for uti sx's was given Keflex and was unable to tx due to type of bacteria and was prescribed macrobid x2 daily x5 days. Still c/o frequency & burning, concerned it may be kidney stone since sx's are similar.     F/u  1. Recurrent uti x 2 since 08/2021 and 01/2022 and given keflex and then macrobid bid x 5 days 02/05/22 c/o freq, burning h/o kidney stones right sided with lithotripsy f/u with urology 04/2022  2. Hearing loss and wants a hearing test this has been worse since 08/2021   Review of Systems  Constitutional:  Negative for weight loss.  HENT:  Negative for hearing loss.   Eyes:  Negative for blurred vision.  Respiratory:  Negative for shortness of breath.   Cardiovascular:  Negative for chest pain.  Gastrointestinal:  Negative for abdominal pain and blood in stool.  Genitourinary:  Positive for dysuria and frequency.  Musculoskeletal:  Negative for falls and joint pain.  Skin:  Negative for rash.  Neurological:  Negative for headaches.  Psychiatric/Behavioral:  Negative for depression.   Past Medical History:  Diagnosis Date   COVID-19    10/23/20   Flu    2022   PONV (postoperative nausea and vomiting)    Sinusitis    07/29/21 sinusitis + flu seen tx'ed urgent care tamiflu and augmentin   Vitiligo    arms, legs FH vitiligo   Past Surgical History:  Procedure Laterality Date   ABDOMINAL HYSTERECTOMY     for endometriosis no h/o abnormal total ovaries uterus,cervix out    BREAST REDUCTION SURGERY     2001   [place] LITHOTRIPSY Right 10/17/2021   Procedure: EXTRACORPOREAL SHOCK WAVE LITHOTRIPSY (ESWL);  Surgeon: Abbie Sons, MD;  Location: [facility];  Service: Urology;  Laterality: Right;   REDUCTION MAMMAPLASTY Bilateral 2001   Family History  Problem Relation  Age of Onset   Heart disease Mother    Thyroid disease Mother    Heart disease Father    Hearing loss Father    Stroke Father    Kidney Stones Father    Birth defects Maternal Grandfather    Heart disease Maternal Grandfather    Stroke Maternal Grandfather    Birth defects Paternal Grandmother    Heart disease Paternal Grandfather    Kidney Stones Son    Breast cancer Neg Hx    Social History   Socioeconomic History   Marital status: Married    Spouse name: Not on file   Number of children: Not on file   Years of education: Not on file   Highest education level: Not on file  Occupational History   Not on file  Tobacco Use   Smoking status: Never   Smokeless tobacco: Never  Substance and Sexual Activity   Alcohol use: Not Currently   Drug use: Never   Sexual activity: Yes    Birth control/protection: None  Other Topics Concern   Not on file  Social History Narrative   2 kids son and daughter    Oncologist    Married    Owns guns, wears seat belt, safe in relationship   Social Determinants of Health   Financial Resource Strain: Not on [employer]  Insecurity: Not on file  Transportation Needs: Not on file  Physical Activity: Not on file  Stress: Not on file  Social Connections: Not on file  Intimate Partner Violence: Not on file   Current Meds  Medication Sig   cetirizine (ZYRTEC) 10 MG tablet Take 1 tablet (10 mg total) by mouth daily as needed for allergies.   nitrofurantoin, macrocrystal-monohydrate, (MACROBID) 100 MG capsule Take 1 capsule (100 mg total) by mouth 2 (two) times daily.   Probiotic Product (PROBIOTIC PO) Take by mouth in the morning and at bedtime. Plexus   No Known Allergies Recent Results (from the past 2160 hour(s))  POCT urinalysis dipstick     Status: Abnormal   Collection Time: 02/05/22  3:55 PM  Result Value Ref Range   Color, UA yellow yellow   Clarity, UA clear clear   Glucose, UA negative negative mg/dL   Bilirubin, UA  negative negative   Ketones, POC UA negative negative mg/dL   Spec Grav, UA 1.010 1.010 - 1.025   Blood, UA negative negative   pH, UA 7.0 5.0 - 8.0   Protein Ur, POC negative negative mg/dL   Urobilinogen, UA 0.2 0.2 or 1.0 E.U./dL   Nitrite, UA Negative Negative   Leukocytes, UA Small (1+) (A) Negative  Urine Culture     Status: Abnormal   Collection Time: 02/05/22  4:23 PM   Specimen: Urine, Clean Catch  Result Value Ref Range   Specimen Description URINE, CLEAN CATCH    Special Requests      NONE Performed at [facility], [address], [place], [place] [address]    Culture (A)     10,000 COLONIES/mL ESCHERICHIA COLI Confirmed Extended Spectrum Beta-Lactamase Producer (ESBL).  In bloodstream infections from ESBL organisms, carbapenems are preferred over piperacillin/tazobactam. They are shown to have a lower risk of mortality.    Report Status 02/08/2022 FINAL    Organism ID, Bacteria ESCHERICHIA COLI (A)       Susceptibility   Escherichia coli - MIC*    AMPICILLIN >=32 RESISTANT Resistant     CEFAZOLIN >=64 RESISTANT Resistant     CEFEPIME 2 SENSITIVE Sensitive     CEFTRIAXONE >=64 RESISTANT Resistant     CIPROFLOXACIN <=0.25 SENSITIVE Sensitive     GENTAMICIN <=1 SENSITIVE Sensitive     IMIPENEM <=0.25 SENSITIVE Sensitive     NITROFURANTOIN <=16 SENSITIVE Sensitive     TRIMETH/SULFA <=20 SENSITIVE Sensitive     AMPICILLIN/SULBACTAM 4 SENSITIVE Sensitive     PIP/TAZO <=4 SENSITIVE Sensitive     * 10,000 COLONIES/mL ESCHERICHIA COLI   Objective  Body mass index is 31.92 kg/m. Wt Readings from Last 3 Encounters:  02/14/22 191 lb 12.8 oz (87 kg)  11/14/21 192 lb (87.1 kg)  10/17/21 192 lb (87.1 kg)   Temp Readings from Last 3 Encounters:  02/14/22 98.1 F (36.7 C) (Oral)  02/05/22 98.2 F (36.8 C) (Temporal)  10/17/21 (!) (P) 97 F (36.1 C) ((P) Temporal)   BP Readings from Last 3 Encounters:  02/14/22 120/80  02/05/22 126/86  11/14/21 114/78    Pulse Readings from Last 3 Encounters:  02/14/22 61  02/05/22 65  11/14/21 68    Physical Exam Vitals and nursing note reviewed.  Constitutional:      Appearance: Normal appearance. She is well-developed and well-groomed.  HENT:     Head: Normocephalic and atraumatic.  Eyes:     Conjunctiva/sclera: Conjunctivae normal.     Pupils: Pupils are equal,  round, and reactive to light.  Cardiovascular:     Rate and Rhythm: Normal rate and regular rhythm.     Heart sounds: Normal heart sounds. No murmur heard. Pulmonary:     Effort: Pulmonary effort is normal.     Breath sounds: Normal breath sounds.  Abdominal:     General: Abdomen is flat. Bowel sounds are normal.     Tenderness: There is no abdominal tenderness.  Musculoskeletal:        General: No tenderness.  Skin:    General: Skin is warm and dry.  Neurological:     General: No focal deficit present.     Mental Status: She is alert and oriented to person, place, and time. Mental status is at baseline.     Cranial Nerves: Cranial nerves 2-12 are intact.     Motor: Motor function is intact.     Coordination: Coordination is intact.     Gait: Gait is intact.  Psychiatric:        Attention and Perception: Attention and perception normal.        Mood and Affect: Mood and affect normal.        Speech: Speech normal.        Behavior: Behavior normal. Behavior is cooperative.        Thought Content: Thought content normal.        Cognition and Memory: Cognition and memory normal.        Judgment: Judgment normal.    Assessment  Plan  Recurrent UTI - Plan: Urinalysis, Routine w reflex microscopic, Urine Culture  Bilateral hearing loss, unspecified hearing loss type - Plan: Ambulatory referral to ENT Dr. Kathyrn Sheriff  HM Fasting labs 03/31/22 Declines flu shot for now may get in future  MMR immune  Tdap vx 03/28/21  Shingrix 2/2 Hep B immune  covid  Vaccine had need to get info per pt had 2/2 declines boosters   Colonoscopy  11/07/16 normal f/u 10 year [place] had colonoscopy   LMP 2004 s/p hysterectomy endometriosis no h/o abnormal pap total ovaries out prev. On estradiol -no need further paps   Dexa ordered can do with mammogram 2024   Mammogram 10/26/19 normal, 04/18/20 normal right axilla sebaceous cyst Utd neg mammo 10/2020 ordered 2023  11/25/21 negative ordered 10/2022    Rec healthy diet and exercise  Provider: Dr. Olivia Mackie McLean-Scocuzza-Internal Medicine

## 2022-02-14 NOTE — Patient Instructions (Addendum)
True you (cranberry + D mannose supplements) [employer] daily for prevention Dr. Elenore Rota ENT hearing check with audiology [phone] Not available [address]   [address]   [place] [address]      Specialties     Otolaryngology       Hearing Loss Hearing loss is a partial or total loss of the ability to hear. This can be temporary or permanent, and it can happen in one or both ears. There are two types of hearing loss. You can have just one type or both types. You may have a problem with: Damage to your hearing nerves (sensorineural hearing loss). This type of hearing loss is more likely to be permanent. A hearing aid is often the best treatment. Sound getting to your inner ear (conductive hearing loss). This type of hearing loss can usually be treated medically or surgically. Medical care is necessary to treat hearing loss properly and to prevent the condition from getting worse. Your hearing may partially or completely come back, depending on what caused your hearing loss and how severe it is. In some cases, hearing loss is permanent. What are the causes? Common causes of hearing loss include: Too much wax in the ear canal. Infection of the ear canal or middle ear. Fluid in the middle ear. Injury to the ear or surrounding area. An object stuck in the ear. A history of prolonged exposure to loud sounds, such as music. Less common causes of hearing loss include: Tumors in the ear. Viral or bacterial infections, such as meningitis. A hole in the eardrum (perforated eardrum). Problems with the hearing nerve that sends signals between the brain and the ear. Certain medicines. What are the signs or symptoms? Symptoms of this condition may include: Difficulty telling the difference between sounds. Difficulty following a conversation when there is background noise. Lack of response to sounds in your environment. This may be most noticeable when you do not  respond to startling sounds. Needing to turn up the volume on the television, radio, or other devices. Ringing in the ears. Dizziness. How is this diagnosed? This condition is diagnosed based on: A physical exam. A hearing test (audiometry). The test will be performed by a hearing specialist (audiologist). Imaging tests, such as an MRI or CT scan. You may also be referred to an ear, nose, and throat (ENT) specialist (otolaryngologist). How is this treated? Treatment for hearing loss may include: Earwax removal. Medicines to treat or prevent infection (antibiotics). Medicines to reduce inflammation (corticosteroids). Hearing aids or assistive listening devices. Follow these instructions at home: If you were prescribed an antibiotic medicine, take it as told by your health care provider. Do not stop taking the antibiotic even if you start to feel better. Take over-the-counter and prescription medicines only as told by your health care provider. Avoid loud noises. Use hearing protection if you are exposed to loud noises or work in a noisy environment. Return to your normal activities as told by your health care provider. Ask your health care provider what activities are safe for you. Keep all follow-up visits. This is important. Contact a health care provider if: You feel dizzy. You develop new symptoms. You vomit or feel nauseous. You have a fever. Get help right away if: You develop sudden changes in your vision. You have severe ear pain. You have new or increased weakness. You have a severe headache. Summary Hearing loss is a decreased ability to hear sounds around you. It can be  temporary or permanent. Treatment will depend on the cause of your hearing loss. It may include earwax removal, medicines, or a hearing aid. Your hearing may partially or completely come back, depending on what caused your hearing loss and how severe it is. Keep all follow-up visits. This is  important. This information is not intended to replace advice given to you by your health care provider. Make sure you discuss any questions you have with your health care provider. Document Revised: 07/25/2021 Document Reviewed: 07/25/2021 Elsevier Patient Education  2023 Elsevier Inc.  Urinary Tract Infection, Adult  A urinary tract infection (UTI) is an infection of any part of the urinary tract. The urinary tract includes the kidneys, ureters, bladder, and urethra. These organs make, store, and get rid of urine in the body. An upper UTI affects the ureters and kidneys. A lower UTI affects the bladder and urethra. What are the causes? Most urinary tract infections are caused by bacteria in your genital area around your urethra, where urine leaves your body. These bacteria grow and cause inflammation of your urinary tract. What increases the risk? You are more likely to develop this condition if: You have a urinary catheter that stays in place. You are not able to control when you urinate or have a bowel movement (incontinence). You are female and you: Use a spermicide or diaphragm for birth control. Have low estrogen levels. Are pregnant. You have certain genes that increase your risk. You are sexually active. You take antibiotic medicines. You have a condition that causes your flow of urine to slow down, such as: An enlarged prostate, if you are female. Blockage in your urethra. A kidney stone. A nerve condition that affects your bladder control (neurogenic bladder). Not getting enough to drink, or not urinating often. You have certain medical conditions, such as: Diabetes. A weak disease-fighting system (immunesystem). Sickle cell disease. Gout. Spinal cord injury. What are the signs or symptoms? Symptoms of this condition include: Needing to urinate right away (urgency). Frequent urination. This may include small amounts of urine each time you urinate. Pain or burning with  urination. Blood in the urine. Urine that smells bad or unusual. Trouble urinating. Cloudy urine. Vaginal discharge, if you are female. Pain in the abdomen or the lower back. You may also have: Vomiting or a decreased appetite. Confusion. Irritability or tiredness. A fever or chills. Diarrhea. The first symptom in older adults may be confusion. In some cases, they may not have any symptoms until the infection has worsened. How is this diagnosed? This condition is diagnosed based on your medical history and a physical exam. You may also have other tests, including: Urine tests. Blood tests. Tests for STIs (sexually transmitted infections). If you have had more than one UTI, a cystoscopy or imaging studies may be done to determine the cause of the infections. How is this treated? Treatment for this condition includes: Antibiotic medicine. Over-the-counter medicines to treat discomfort. Drinking enough water to stay hydrated. If you have frequent infections or have other conditions such as a kidney stone, you may need to see a health care provider who specializes in the urinary tract (urologist). In rare cases, urinary tract infections can cause sepsis. Sepsis is a life-threatening condition that occurs when the body responds to an infection. Sepsis is treated in the hospital with IV antibiotics, fluids, and other medicines. Follow these instructions at home:  Medicines Take over-the-counter and prescription medicines only as told by your health care provider. If you were prescribed  an antibiotic medicine, take it as told by your health care provider. Do not stop using the antibiotic even if you start to feel better. General instructions Make sure you: Empty your bladder often and completely. Do not hold urine for long periods of time. Empty your bladder after sex. Wipe from front to back after urinating or having a bowel movement if you are female. Use each tissue only one time when  you wipe. Drink enough fluid to keep your urine pale yellow. Keep all follow-up visits. This is important. Contact a health care provider if: Your symptoms do not get better after 1-2 days. Your symptoms go away and then return. Get help right away if: You have severe pain in your back or your lower abdomen. You have a fever or chills. You have nausea or vomiting. Summary A urinary tract infection (UTI) is an infection of any part of the urinary tract, which includes the kidneys, ureters, bladder, and urethra. Most urinary tract infections are caused by bacteria in your genital area. Treatment for this condition often includes antibiotic medicines. If you were prescribed an antibiotic medicine, take it as told by your health care provider. Do not stop using the antibiotic even if you start to feel better. Keep all follow-up visits. This is important. This information is not intended to replace advice given to you by your health care provider. Make sure you discuss any questions you have with your health care provider. Document Revised: 04/27/2020 Document Reviewed: 04/27/2020 Elsevier Patient Education  2023 [employer]

## 2022-02-15 LAB — URINALYSIS, ROUTINE W REFLEX MICROSCOPIC
Bilirubin Urine: NEGATIVE
Glucose, UA: NEGATIVE
Hgb urine dipstick: NEGATIVE
Ketones, ur: NEGATIVE
Nitrite: NEGATIVE
Protein, ur: NEGATIVE
Specific Gravity, Urine: 1.01 (ref 1.001–1.035)
pH: 6 (ref 5.0–8.0)

## 2022-02-15 LAB — URINE CULTURE
MICRO NUMBER:: 13420545
SPECIMEN QUALITY:: ADEQUATE

## 2022-02-15 LAB — MICROSCOPIC MESSAGE

## 2022-02-17 ENCOUNTER — Telehealth: Payer: Self-pay

## 2022-02-17 NOTE — Telephone Encounter (Signed)
Lvm for pt to return call in regards to urine results.  Per Dr.Tracy: Normal bacteria in private area  No UTI

## 2022-03-24 ENCOUNTER — Encounter: Payer: Self-pay | Admitting: Internal Medicine

## 2022-03-31 ENCOUNTER — Other Ambulatory Visit (INDEPENDENT_AMBULATORY_CARE_PROVIDER_SITE_OTHER): Payer: BC Managed Care – PPO

## 2022-03-31 DIAGNOSIS — E559 Vitamin D deficiency, unspecified: Secondary | ICD-10-CM | POA: Diagnosis not present

## 2022-03-31 DIAGNOSIS — Z13220 Encounter for screening for lipoid disorders: Secondary | ICD-10-CM

## 2022-03-31 DIAGNOSIS — Z0000 Encounter for general adult medical examination without abnormal findings: Secondary | ICD-10-CM

## 2022-03-31 DIAGNOSIS — Z1329 Encounter for screening for other suspected endocrine disorder: Secondary | ICD-10-CM | POA: Diagnosis not present

## 2022-03-31 DIAGNOSIS — R319 Hematuria, unspecified: Secondary | ICD-10-CM | POA: Diagnosis not present

## 2022-03-31 LAB — CBC WITH DIFFERENTIAL/PLATELET
Basophils Absolute: 0.1 10*3/uL (ref 0.0–0.1)
Basophils Relative: 1 % (ref 0.0–3.0)
Eosinophils Absolute: 0.3 10*3/uL (ref 0.0–0.7)
Eosinophils Relative: 4.6 % (ref 0.0–5.0)
HCT: 39.7 % (ref 36.0–46.0)
Hemoglobin: 13.1 g/dL (ref 12.0–15.0)
Lymphocytes Relative: 22.5 % (ref 12.0–46.0)
Lymphs Abs: 1.6 10*3/uL (ref 0.7–4.0)
MCHC: 32.9 g/dL (ref 30.0–36.0)
MCV: 90.6 fl (ref 78.0–100.0)
Monocytes Absolute: 0.5 10*3/uL (ref 0.1–1.0)
Monocytes Relative: 7.2 % (ref 3.0–12.0)
Neutro Abs: 4.5 10*3/uL (ref 1.4–7.7)
Neutrophils Relative %: 64.7 % (ref 43.0–77.0)
Platelets: 264 10*3/uL (ref 150.0–400.0)
RBC: 4.39 Mil/uL (ref 3.87–5.11)
RDW: 14.1 % (ref 11.5–15.5)
WBC: 7 10*3/uL (ref 4.0–10.5)

## 2022-03-31 LAB — LIPID PANEL
Cholesterol: 168 mg/dL (ref 0–200)
HDL: 64.5 mg/dL (ref >39.00)
LDL Cholesterol: 86 mg/dL (ref 0–99)
NonHDL: 103.8
Total CHOL/HDL Ratio: 3
Triglycerides: 90 mg/dL (ref 0.0–149.0)
VLDL: 18 mg/dL (ref 0.0–40.0)

## 2022-03-31 LAB — COMPREHENSIVE METABOLIC PANEL
ALT: 16 U/L (ref 0–35)
AST: 17 U/L (ref 0–37)
Albumin: 4.3 g/dL (ref 3.5–5.2)
Alkaline Phosphatase: 97 U/L (ref 39–117)
BUN: 13 mg/dL (ref 6–23)
CO2: 25 mEq/L (ref 19–32)
Calcium: 9.1 mg/dL (ref 8.4–10.5)
Chloride: 103 mEq/L (ref 96–112)
Creatinine, Ser: 0.81 mg/dL (ref 0.40–1.20)
GFR: 81.6 mL/min (ref >60.00)
Glucose, Bld: 91 mg/dL (ref 70–99)
Potassium: 3.9 mEq/L (ref 3.5–5.1)
Sodium: 137 mEq/L (ref 135–145)
Total Bilirubin: 0.7 mg/dL (ref 0.2–1.2)
Total Protein: 7.6 g/dL (ref 6.0–8.3)

## 2022-03-31 LAB — TSH: TSH: 7.04 u[IU]/mL — ABNORMAL HIGH (ref 0.35–5.50)

## 2022-03-31 LAB — VITAMIN D 25 HYDROXY (VIT D DEFICIENCY, FRACTURES): VITD: 25.75 ng/mL — ABNORMAL LOW (ref 30.00–100.00)

## 2022-04-02 ENCOUNTER — Telehealth: Payer: Self-pay | Admitting: Internal Medicine

## 2022-04-02 ENCOUNTER — Encounter: Payer: Self-pay | Admitting: Internal Medicine

## 2022-04-02 ENCOUNTER — Ambulatory Visit (INDEPENDENT_AMBULATORY_CARE_PROVIDER_SITE_OTHER): Payer: BC Managed Care – PPO | Admitting: Internal Medicine

## 2022-04-02 VITALS — BP 114/78 | HR 67 | Temp 98.1°F | Resp 14 | Ht 65.0 in | Wt 192.4 lb

## 2022-04-02 DIAGNOSIS — Z0000 Encounter for general adult medical examination without abnormal findings: Secondary | ICD-10-CM | POA: Diagnosis not present

## 2022-04-02 DIAGNOSIS — R946 Abnormal results of thyroid function studies: Secondary | ICD-10-CM

## 2022-04-02 NOTE — Progress Notes (Signed)
Chief Complaint  Patient presents with   Annual Exam    Labs completed 03/31/22 disc, denies any pain.   Annual  1. Doing well tsh elevated fh mom hypothyroidism on meds  2. Vit D def rec d3 5000 IU daily     Review of Systems  Constitutional:  Negative for weight loss.  HENT:  Negative for hearing loss.   Eyes:  Negative for blurred vision.  Respiratory:  Negative for shortness of breath.   Cardiovascular:  Negative for chest pain.  Gastrointestinal:  Negative for abdominal pain and blood in stool.  Genitourinary:  Negative for dysuria.  Musculoskeletal:  Negative for falls and joint pain.  Skin:  Negative for rash.  Neurological:  Negative for headaches.  Psychiatric/Behavioral:  Negative for depression.    Past Medical History:  Diagnosis Date   COVID-19    10/23/20   Flu    2022   PONV (postoperative nausea and vomiting)    Right kidney stone    Sinusitis    07/29/21 sinusitis + flu seen tx'ed urgent care tamiflu and augmentin   Vitiligo    arms, legs FH vitiligo   Past Surgical History:  Procedure Laterality Date   ABDOMINAL HYSTERECTOMY     for endometriosis no h/o abnormal total ovaries uterus,cervix out    BREAST REDUCTION SURGERY     2001   [place] LITHOTRIPSY Right 10/17/2021   Procedure: EXTRACORPOREAL SHOCK WAVE LITHOTRIPSY (ESWL);  Surgeon: Abbie Sons, MD;  Location: [facility] ORS;  Service: Urology;  Laterality: Right;   REDUCTION MAMMAPLASTY Bilateral 2001   Family History  Problem Relation Age of Onset   Heart disease Mother    Thyroid disease Mother        hypothyroidism   Heart disease Father    Hearing loss Father    Stroke Father    Kidney Stones Father    Birth defects Maternal Grandfather    Heart disease Maternal Grandfather    Stroke Maternal Grandfather    Birth defects Paternal Grandmother    Heart disease Paternal Grandfather    Kidney Stones Son    Breast cancer  Neg Hx    Social History   Socioeconomic History   Marital status: Married    Spouse name: Not on file   Number of children: Not on file   Years of education: Not on file   Highest education level: Not on file  Occupational History   Not on file  Tobacco Use   Smoking status: Never   Smokeless tobacco: Never  Substance and Sexual Activity   Alcohol use: Not Currently   Drug use: Never   Sexual activity: Yes    Birth control/protection: None  Other Topics Concern   Not on file  Social History Narrative   2 kids son and daughter    Oncologist    Married    Owns guns, wears seat belt, safe in relationship   Social Determinants of Health   Financial Resource Strain: Not on file  Food Insecurity: Not on file  Transportation Needs: Not on file  Physical Activity: Not on file  Stress: Not on file  Social Connections: Not on file  Intimate Partner Violence: Not on file   Current Meds  Medication Sig   cetirizine (ZYRTEC) 10 MG tablet Take 1 tablet (10 mg total) by mouth daily as needed for allergies.   No Known  Allergies Recent Results (from the past 2160 hour(s))  POCT urinalysis dipstick     Status: Abnormal   Collection Time: 02/05/22  3:55 PM  Result Value Ref Range   Color, UA yellow yellow   Clarity, UA clear clear   Glucose, UA negative negative mg/dL   Bilirubin, UA negative negative   Ketones, POC UA negative negative mg/dL   Spec Grav, UA 1.010 1.010 - 1.025   Blood, UA negative negative   pH, UA 7.0 5.0 - 8.0   Protein Ur, POC negative negative mg/dL   Urobilinogen, UA 0.2 0.2 or 1.0 E.U./dL   Nitrite, UA Negative Negative   Leukocytes, UA Small (1+) (A) Negative  Urine Culture     Status: Abnormal   Collection Time: 02/05/22  4:23 PM   Specimen: Urine, Clean Catch  Result Value Ref Range   Specimen Description URINE, CLEAN CATCH    Special Requests      NONE Performed at [facility], [address] [place], [place] [address]     Culture (A)     10,000 COLONIES/mL ESCHERICHIA COLI Confirmed Extended Spectrum Beta-Lactamase Producer (ESBL).  In bloodstream infections from ESBL organisms, carbapenems are preferred over piperacillin/tazobactam. They are shown to have a lower risk of mortality.    Report Status 02/08/2022 FINAL    Organism ID, Bacteria ESCHERICHIA COLI (A)       Susceptibility   Escherichia coli - MIC*    AMPICILLIN >=32 RESISTANT Resistant     CEFAZOLIN >=64 RESISTANT Resistant     CEFEPIME 2 SENSITIVE Sensitive     CEFTRIAXONE >=64 RESISTANT Resistant     CIPROFLOXACIN <=0.25 SENSITIVE Sensitive     GENTAMICIN <=1 SENSITIVE Sensitive     IMIPENEM <=0.25 SENSITIVE Sensitive     NITROFURANTOIN <=16 SENSITIVE Sensitive     TRIMETH/SULFA <=20 SENSITIVE Sensitive     AMPICILLIN/SULBACTAM 4 SENSITIVE Sensitive     PIP/TAZO <=4 SENSITIVE Sensitive     * 10,000 COLONIES/mL ESCHERICHIA COLI  Urinalysis, Routine w reflex microscopic     Status: Abnormal   Collection Time: 02/14/22  4:36 PM  Result Value Ref Range   Color, Urine YELLOW YELLOW   APPearance CLEAR CLEAR   Specific Gravity, Urine 1.010 1.001 - 1.035   pH 6.0 5.0 - 8.0   Glucose, UA NEGATIVE NEGATIVE   Bilirubin Urine NEGATIVE NEGATIVE   Ketones, ur NEGATIVE NEGATIVE   Hgb urine dipstick NEGATIVE NEGATIVE   Protein, ur NEGATIVE NEGATIVE   Nitrite NEGATIVE NEGATIVE   Leukocytes,Ua TRACE (A) NEGATIVE  Urine Culture     Status: None   Collection Time: 02/14/22  4:36 PM   Specimen: Urine  Result Value Ref Range   MICRO NUMBER: [id number]    SPECIMEN QUALITY: Adequate    Sample Source NOT GIVEN    STATUS: FINAL    Result:      Mixed genital flora isolated. These superficial bacteria are not indicative of a urinary tract infection. No further organism identification is warranted on this specimen. If clinically indicated, recollect clean-catch, mid-stream urine and transfer  immediately to Urine Culture Transport Tube.   MICROSCOPIC  MESSAGE     Status: None   Collection Time: 02/14/22  4:36 PM  Result Value Ref Range   Note      Comment: This urine was analyzed for the presence of WBC,  RBC, bacteria, casts, and other formed elements.  Only those elements seen were reported. . .   CBC with Differential/Platelet  Status: None   Collection Time: 03/31/22  7:33 AM  Result Value Ref Range   WBC 7.0 4.0 - 10.5 K/uL   RBC 4.39 3.87 - 5.11 Mil/uL   Hemoglobin 13.1 12.0 - 15.0 g/dL   HCT 39.7 36.0 - 46.0 %   MCV 90.6 78.0 - 100.0 fl   MCHC 32.9 30.0 - 36.0 g/dL   RDW 14.1 11.5 - 15.5 %   Platelets 264.0 150.0 - 400.0 K/uL   Neutrophils Relative % 64.7 43.0 - 77.0 %   Lymphocytes Relative 22.5 12.0 - 46.0 %   Monocytes Relative 7.2 3.0 - 12.0 %   Eosinophils Relative 4.6 0.0 - 5.0 %   Basophils Relative 1.0 0.0 - 3.0 %   Neutro Abs 4.5 1.4 - 7.7 K/uL   Lymphs Abs 1.6 0.7 - 4.0 K/uL   Monocytes Absolute 0.5 0.1 - 1.0 K/uL   Eosinophils Absolute 0.3 0.0 - 0.7 K/uL   Basophils Absolute 0.1 0.0 - 0.1 K/uL  Comprehensive metabolic panel     Status: None   Collection Time: 03/31/22  7:33 AM  Result Value Ref Range   Sodium 137 135 - 145 mEq/L   Potassium 3.9 3.5 - 5.1 mEq/L   Chloride 103 96 - 112 mEq/L   CO2 25 19 - 32 mEq/L   Glucose, Bld 91 70 - 99 mg/dL   BUN 13 6 - 23 mg/dL   Creatinine, Ser 0.81 0.40 - 1.20 mg/dL   Total Bilirubin 0.7 0.2 - 1.2 mg/dL   Alkaline Phosphatase 97 39 - 117 U/L   AST 17 0 - 37 U/L   ALT 16 0 - 35 U/L   Total Protein 7.6 6.0 - 8.3 g/dL   Albumin 4.3 3.5 - 5.2 g/dL   GFR 81.60 >60.00 mL/min    Comment: Calculated using the CKD-EPI Creatinine Equation (2021)   Calcium 9.1 8.4 - 10.5 mg/dL  Vitamin D (25 hydroxy)     Status: Abnormal   Collection Time: 03/31/22  7:33 AM  Result Value Ref Range   VITD 25.75 (L) 30.00 - 100.00 ng/mL  TSH     Status: Abnormal   Collection Time: 03/31/22  7:33 AM  Result Value Ref Range   TSH 7.04 (H) 0.35 - 5.50 uIU/mL  Lipid panel      Status: None   Collection Time: 03/31/22  7:33 AM  Result Value Ref Range   Cholesterol 168 0 - 200 mg/dL    Comment: ATP III Classification       Desirable:  < 200 mg/dL               Borderline High:  200 - 239 mg/dL          High:  > = 240 mg/dL   Triglycerides 90.0 0.0 - 149.0 mg/dL    Comment: Normal:  <150 mg/dLBorderline High:  150 - 199 mg/dL   HDL 64.50 >39.00 mg/dL   VLDL 18.0 0.0 - 40.0 mg/dL   LDL Cholesterol 86 0 - 99 mg/dL   Total CHOL/HDL Ratio 3     Comment:                Men          Women1/2 Average Risk     3.4          3.3Average Risk          5.0          4.42X Average Risk  9.6          7.13X Average Risk          15.0          11.0                       NonHDL 103.80     Comment: NOTE:  Non-HDL goal should be 30 mg/dL higher than patient's LDL goal (i.e. LDL goal of < 70 mg/dL, would have non-HDL goal of < 100 mg/dL)   Objective  Body mass index is 32.02 kg/m. Wt Readings from Last 3 Encounters:  04/02/22 192 lb 6.4 oz (87.3 kg)  02/14/22 191 lb 12.8 oz (87 kg)  11/14/21 192 lb (87.1 kg)   Temp Readings from Last 3 Encounters:  04/02/22 98.1 F (36.7 C) (Oral)  02/14/22 98.1 F (36.7 C) (Oral)  02/05/22 98.2 F (36.8 C) (Temporal)   BP Readings from Last 3 Encounters:  04/02/22 114/78  02/14/22 120/80  02/05/22 126/86   Pulse Readings from Last 3 Encounters:  04/02/22 67  02/14/22 61  02/05/22 65    Physical Exam Vitals and nursing note reviewed.  Constitutional:      Appearance: Normal appearance. She is well-developed and well-groomed.  HENT:     Head: Normocephalic and atraumatic.  Eyes:     Conjunctiva/sclera: Conjunctivae normal.     Pupils: Pupils are equal, round, and reactive to light.  Cardiovascular:     Rate and Rhythm: Normal rate and regular rhythm.     Heart sounds: Normal heart sounds. No murmur heard. Pulmonary:     Effort: Pulmonary effort is normal.     Breath sounds: Normal breath sounds.  Abdominal:      General: Abdomen is flat. Bowel sounds are normal.     Tenderness: There is no abdominal tenderness.  Musculoskeletal:        General: No tenderness.  Skin:    General: Skin is warm and dry.  Neurological:     General: No focal deficit present.     Mental Status: She is alert and oriented to person, place, and time. Mental status is at baseline.     Cranial Nerves: Cranial nerves 2-12 are intact.     Motor: Motor function is intact.     Coordination: Coordination is intact.     Gait: Gait is intact.  Psychiatric:        Attention and Perception: Attention and perception normal.        Mood and Affect: Mood and affect normal.        Speech: Speech normal.        Behavior: Behavior normal. Behavior is cooperative.        Thought Content: Thought content normal.        Cognition and Memory: Cognition and memory normal.        Judgment: Judgment normal.     Assessment  Plan  Annual physical exam  Abnormal thyroid function test - Plan: Thyroid peroxidase antibody, T4, free, TSH, T3, free, US THYROID  HM Fasting labs 03/31/22 Declines flu shot for now may get in future  MMR immune  Tdap vx 03/28/21  Shingrix 2/2 Hep B immune  covid  Vaccine had need to get info per pt had 2/2 declines boosters   Colonoscopy 11/07/16 normal f/u 10 year [place] had colonoscopy   LMP 2004 s/p hysterectomy endometriosis no h/o abnormal pap total ovaries out prev. On estradiol -no need further  paps   Dexa scheduled 06/04/22   Mammogram 10/26/19 normal, 04/18/20 normal right axilla sebaceous cyst Utd neg mammo 10/2020 ordered 2023  11/25/21 negative ordered 10/2022    Rec healthy diet and exercise   Established audiology  Established urology Provider: Dr. Olivia Mackie McLean-Scocuzza-[facility]

## 2022-04-02 NOTE — Telephone Encounter (Signed)
Lft pt vm to call ofc . thanks 

## 2022-04-02 NOTE — Patient Instructions (Addendum)
Warm prune  Water 55064 ounces daily  Miralax as needed (1-2 caps in 8 ounces liquid) + colace 100-200 daily   Colonoscopy 11/07/2026  Mammogram due 11/25/22  [facility] outpatient imaging [address], [place], [place] [address] Hours:  Phone: [phone]

## 2022-04-04 ENCOUNTER — Encounter: Payer: Self-pay | Admitting: Internal Medicine

## 2022-04-14 ENCOUNTER — Ambulatory Visit
Admission: RE | Admit: 2022-04-14 | Discharge: 2022-04-14 | Disposition: A | Discharge status: Home / Self Care | Payer: BC Managed Care – PPO | Source: Ambulatory Visit | Attending: Internal Medicine | Admitting: Internal Medicine

## 2022-04-14 DIAGNOSIS — R946 Abnormal results of thyroid function studies: Secondary | ICD-10-CM | POA: Diagnosis present

## 2022-04-29 ENCOUNTER — Other Ambulatory Visit: Payer: Self-pay | Admitting: Internal Medicine

## 2022-04-29 DIAGNOSIS — Z1231 Encounter for screening mammogram for malignant neoplasm of breast: Secondary | ICD-10-CM

## 2022-05-15 ENCOUNTER — Ambulatory Visit
Admission: RE | Admit: 2022-05-15 | Discharge: 2022-05-15 | Disposition: A | Discharge status: Home / Self Care | Payer: BC Managed Care – PPO | Attending: Urology | Admitting: Urology

## 2022-05-15 ENCOUNTER — Ambulatory Visit: Payer: BC Managed Care – PPO | Admitting: Urology

## 2022-05-15 ENCOUNTER — Encounter: Payer: Self-pay | Admitting: Urology

## 2022-05-15 ENCOUNTER — Ambulatory Visit
Admission: RE | Admit: 2022-05-15 | Discharge: 2022-05-15 | Disposition: A | Discharge status: Home / Self Care | Payer: BC Managed Care – PPO | Source: Ambulatory Visit | Attending: Urology | Admitting: Urology

## 2022-05-15 VITALS — BP 120/79 | HR 69 | Ht 65.0 in | Wt 194.0 lb

## 2022-05-15 DIAGNOSIS — N200 Calculus of kidney: Secondary | ICD-10-CM | POA: Diagnosis present

## 2022-05-15 DIAGNOSIS — M545 Low back pain, unspecified: Secondary | ICD-10-CM | POA: Diagnosis not present

## 2022-05-15 DIAGNOSIS — N201 Calculus of ureter: Secondary | ICD-10-CM

## 2022-05-15 DIAGNOSIS — R3989 Other symptoms and signs involving the genitourinary system: Secondary | ICD-10-CM

## 2022-05-15 LAB — URINALYSIS, COMPLETE
Bilirubin, UA: NEGATIVE
Glucose, UA: NEGATIVE
Ketones, UA: NEGATIVE
Leukocytes,UA: NEGATIVE
Nitrite, UA: NEGATIVE
Protein,UA: NEGATIVE
RBC, UA: NEGATIVE
Specific Gravity, UA: 1.01 (ref 1.005–1.030)
Urobilinogen, Ur: 0.2 mg/dL (ref 0.2–1.0)
pH, UA: 6.5 (ref 5.0–7.5)

## 2022-05-15 LAB — MICROSCOPIC EXAMINATION

## 2022-05-15 NOTE — Progress Notes (Signed)
05/15/22 9:50 PM   Candace Joseph May 01, 1966 [id number]  Referring provider:  McLean-Scocuzza, Pasty Spillers, MD [address] [place],  [place] [address]  Urological history  Nephrolithiasis  - A CT was ordered on 10/03/2021 by her PCP for her persistent back pain, and showed a 5 mm nonobstructing right mid pole renal stone - Underwent ESWL on 10/17/2021 for right renal stone with Dr. Lonna Cobb.   - KUB on 11/11/2021 visualized renal stone no longer visualized. Lower abdominal and pelvic calcifications consistent phleboliths   Chief Complaint  Patient presents with   Nephrolithiasis   Follow-up      HPI: Candace Joseph is a 56 y.o.female who presents today for a 6 month follow-up with KUB.   She has been having some right lower back pain that started over the last week. She was with her parents over the weekend and was engaged in a lot heavy lifting.  She is hoping that this is not another kidney stone. She is having dysuria on an intermittent basis.    Patient denies any modifying or aggravating factors.  Patient denies any gross hematuria, dysuria or suprapubic/flank pain.  Patient denies any fevers, chills, nausea or vomiting.   UA benign.  KUB demonstrates a 3 mm and 2 mm right mid renal calculus.   PMH: Past Medical History:  Diagnosis Date   COVID-19    10/23/20   Flu    2022   PONV (postoperative nausea and vomiting)    Right kidney stone    Sinusitis    07/29/21 sinusitis + flu seen tx'ed urgent care tamiflu and augmentin   Vitiligo    arms, legs FH vitiligo    Surgical History: Past Surgical History:  Procedure Laterality Date   ABDOMINAL HYSTERECTOMY     for endometriosis no h/o abnormal total ovaries uterus,cervix out    BREAST REDUCTION SURGERY     2001   CESAREAN SECTION     2000   CHOLECYSTECTOMY     EXTRACORPOREAL SHOCK WAVE LITHOTRIPSY Right 10/17/2021   Procedure: EXTRACORPOREAL SHOCK WAVE LITHOTRIPSY (ESWL);  Surgeon: Riki Altes, MD;  Location:  [facility] ORS;  Service: Urology;  Laterality: Right;   REDUCTION MAMMAPLASTY Bilateral 2001    Home Medications:  Allergies as of 05/15/2022   No Known Allergies      Medication List        Accurate as of May 15, 2022  9:50 PM. If you have any questions, ask your nurse or doctor.          cetirizine 10 MG tablet Commonly known as: ZYRTEC Take 1 tablet (10 mg total) by mouth daily as needed for allergies.        Allergies:  No Known Allergies  Family History: Family History  Problem Relation Age of Onset   Heart disease Mother    Thyroid disease Mother        hypothyroidism   Heart disease Father    Hearing loss Father    Stroke Father    Kidney Stones Father    Birth defects Maternal Grandfather    Heart disease Maternal Grandfather    Stroke Maternal Grandfather    Birth defects Paternal Grandmother    Heart disease Paternal Grandfather    Kidney Stones Son    Breast cancer Neg Hx     Social History:  reports that she has never smoked. She has never used smokeless tobacco. She reports that she does not currently use alcohol. She reports that  she does not use drugs.   Physical Exam: BP 120/79   Pulse 69   Ht 5\' 5"  (1.651 m)   Wt 194 lb (88 kg)   BMI 32.28 kg/m   Constitutional:  Alert and oriented, No acute distress. HEENT: [place] AT, moist mucus membranes.  Trachea midline. Cardiovascular: No clubbing, cyanosis, or edema. Respiratory: Normal respiratory effort, no increased work of breathing. Neurologic: Grossly intact, no focal deficits, moving all 4 extremities. Psychiatric: Normal mood and affect.  Laboratory Data: Lab Results  Component Value Date   CREATININE 0.81 03/31/2022   CBC    Component Value Date/Time   WBC 7.0 03/31/2022 0733   RBC 4.39 03/31/2022 0733   HGB 13.1 03/31/2022 0733   HCT 39.7 03/31/2022 0733   PLT 264.0 03/31/2022 0733   MCV 90.6 03/31/2022 0733   MCH 29.1 03/28/2020 0952   MCHC 32.9 03/31/2022 0733   RDW 14.1  03/31/2022 0733   LYMPHSABS 1.6 03/31/2022 0733   MONOABS 0.5 03/31/2022 0733   EOSABS 0.3 03/31/2022 0733   BASOSABS 0.1 03/31/2022 0733    CMP     Component Value Date/Time   NA 137 03/31/2022 0733   K 3.9 03/31/2022 0733   CL 103 03/31/2022 0733   CO2 25 03/31/2022 0733   GLUCOSE 91 03/31/2022 0733   BUN 13 03/31/2022 0733   CREATININE 0.81 03/31/2022 0733   CREATININE 0.88 03/28/2020 0952   CALCIUM 9.1 03/31/2022 0733   PROT 7.6 03/31/2022 0733   ALBUMIN 4.3 03/31/2022 0733   AST 17 03/31/2022 0733   ALT 16 03/31/2022 0733   ALKPHOS 97 03/31/2022 0733   BILITOT 0.7 03/31/2022 0733      Latest Ref Rng & Units 03/31/2022    7:33 AM 09/09/2021    4:24 PM 04/18/2021    7:35 AM  BMP  Glucose 70 - 99 mg/dL 91  89  96   BUN 6 - 23 mg/dL 13  13  11    Creatinine 0.40 - 1.20 mg/dL 04/20/2021   8.41   Sodium 135 - 145 mEq/L 137  134  136   Potassium 3.5 - 5.1 mEq/L 3.9  3.9  4.3   Chloride 96 - 112 mEq/L 103  97  102   CO2 19 - 32 mEq/L 25  29  25    Calcium 8.4 - 10.5 mg/dL 9.1  9.1  9.2    Urinalysis Component     Latest Ref Rng 05/15/2022  Glucose, UA     Negative  Negative   Color, UA     Yellow  Yellow   Bilirubin, UA     Negative  Negative   Ketones, UA     Negative  Negative   Specific Gravity, UA     1.005 - 1.030  1.010   RBC, UA     Negative  Negative   pH, UA     5.0 - 7.5  6.5   Protein,UA     Negative/Trace  Negative   Nitrite, UA     Negative  Negative   Leukocytes,UA     Negative  Negative   Appearance Ur     Clear  Clear   Urobilinogen, Ur     0.2 - 1.0 mg/dL 0.2   Microscopic Examination See below:    Component     Latest Ref Rng 05/15/2022  WBC, UA     0 - 5 /hpf 0-5   Bacteria, UA     None seen/Few  Few   RBC, Urine     0 - 2 /hpf 0-2   Epithelial Cells (non renal)     0 - 10 /hpf 0-10   I have reviewed the labs.   Pertinent Imaging: KUB right renal stone I have independently reviewed the films.  See HPI.  Radiologist  interpretation pending  Assessment & Plan:    1. Right renal stone -KUB demonstrates stable right renal calculus -likely not the cause for her right lower back pain -if her pain continues, may consider CT renal stone study -UA benign -urine culture for completeness   Return for pending urine culture results .  [facility] [address] [place], [place] [address] [phone]

## 2022-05-16 ENCOUNTER — Other Ambulatory Visit (INDEPENDENT_AMBULATORY_CARE_PROVIDER_SITE_OTHER): Payer: BC Managed Care – PPO

## 2022-05-16 DIAGNOSIS — R946 Abnormal results of thyroid function studies: Secondary | ICD-10-CM

## 2022-05-16 NOTE — Addendum Note (Signed)
Addended by: Warden Fillers on: 05/16/2022 12:01 PM   Modules accepted: Orders

## 2022-05-19 ENCOUNTER — Encounter: Payer: Self-pay | Admitting: Internal Medicine

## 2022-05-19 ENCOUNTER — Telehealth: Payer: Self-pay

## 2022-05-19 DIAGNOSIS — E063 Autoimmune thyroiditis: Secondary | ICD-10-CM | POA: Insufficient documentation

## 2022-05-19 LAB — THYROID PEROXIDASE ANTIBODY: Thyroperoxidase Ab SerPl-aCnc: 252 IU/mL — ABNORMAL HIGH (ref <9)

## 2022-05-19 LAB — CULTURE, URINE COMPREHENSIVE

## 2022-05-19 LAB — TSH: TSH: 2.82 mIU/L

## 2022-05-19 LAB — T3, FREE: T3, Free: 2.8 pg/mL (ref 2.3–4.2)

## 2022-05-19 LAB — T4, FREE: Free T4: 1 ng/dL (ref 0.8–1.8)

## 2022-05-19 NOTE — Telephone Encounter (Signed)
Ok per DPR, [facility] notifying pt. Advised pt to return call if she would like to have CT workup.

## 2022-05-19 NOTE — Telephone Encounter (Signed)
-----   Message from Harle Battiest, PA-C sent at 05/19/2022 12:43 PM EDT ----- Please let Candace Joseph know that her urine culture was negative for infection.  If she is still having pain, we can order a CT scan for further evaluation.

## 2022-05-31 ENCOUNTER — Emergency Department
Admission: EM | Admit: 2022-05-31 | Discharge: 2022-05-31 | Disposition: A | Discharge status: Home / Self Care | Payer: BC Managed Care – PPO | Attending: Emergency Medicine | Admitting: Emergency Medicine

## 2022-05-31 ENCOUNTER — Emergency Department: Payer: BC Managed Care – PPO

## 2022-05-31 ENCOUNTER — Other Ambulatory Visit: Payer: Self-pay

## 2022-05-31 DIAGNOSIS — R109 Unspecified abdominal pain: Secondary | ICD-10-CM

## 2022-05-31 DIAGNOSIS — N390 Urinary tract infection, site not specified: Secondary | ICD-10-CM | POA: Insufficient documentation

## 2022-05-31 LAB — CBC WITH DIFFERENTIAL/PLATELET
Abs Immature Granulocytes: 0.01 10*3/uL (ref 0.00–0.07)
Basophils Absolute: 0.1 10*3/uL (ref 0.0–0.1)
Basophils Relative: 1 %
Eosinophils Absolute: 0.3 10*3/uL (ref 0.0–0.5)
Eosinophils Relative: 5 %
HCT: 43.9 % (ref 36.0–46.0)
Hemoglobin: 13.7 g/dL (ref 12.0–15.0)
Immature Granulocytes: 0 %
Lymphocytes Relative: 26 %
Lymphs Abs: 1.8 10*3/uL (ref 0.7–4.0)
MCH: 29.3 pg (ref 26.0–34.0)
MCHC: 31.2 g/dL (ref 30.0–36.0)
MCV: 93.8 fL (ref 80.0–100.0)
Monocytes Absolute: 0.5 10*3/uL (ref 0.1–1.0)
Monocytes Relative: 8 %
Neutro Abs: 4 10*3/uL (ref 1.7–7.7)
Neutrophils Relative %: 60 %
Platelets: 329 10*3/uL (ref 150–400)
RBC: 4.68 MIL/uL (ref 3.87–5.11)
RDW: 13.1 % (ref 11.5–15.5)
WBC: 6.7 10*3/uL (ref 4.0–10.5)
nRBC: 0 % (ref 0.0–0.2)

## 2022-05-31 LAB — URINALYSIS, ROUTINE W REFLEX MICROSCOPIC
Bacteria, UA: NONE SEEN
Bilirubin Urine: NEGATIVE
Glucose, UA: NEGATIVE mg/dL
Hgb urine dipstick: NEGATIVE
Ketones, ur: NEGATIVE mg/dL
Nitrite: NEGATIVE
Protein, ur: NEGATIVE mg/dL
Specific Gravity, Urine: 1.01 (ref 1.005–1.030)
WBC, UA: >50 WBC/hpf — ABNORMAL HIGH (ref 0–5)
pH: 6 (ref 5.0–8.0)

## 2022-05-31 LAB — BASIC METABOLIC PANEL
Anion gap: 5 (ref 5–15)
BUN: 18 mg/dL (ref 6–20)
CO2: 27 mmol/L (ref 22–32)
Calcium: 8.9 mg/dL (ref 8.9–10.3)
Chloride: 104 mmol/L (ref 98–111)
Creatinine, Ser: 0.85 mg/dL (ref 0.44–1.00)
GFR, Estimated: >60 mL/min (ref >60)
Glucose, Bld: 104 mg/dL — ABNORMAL HIGH (ref 70–99)
Potassium: 4.2 mmol/L (ref 3.5–5.1)
Sodium: 136 mmol/L (ref 135–145)

## 2022-05-31 MED ORDER — ONDANSETRON 4 MG PO TBDP
4.0000 mg | ORAL_TABLET | Freq: Once | ORAL | Status: AC
  Administered 2022-05-31: 4 mg via ORAL
  Filled 2022-05-31: qty 1

## 2022-05-31 MED ORDER — ONDANSETRON 4 MG PO TBDP: 4.0000 mg | ORAL_TABLET | Freq: Three times a day (TID) | ORAL | 0 refills | Status: DC | PRN

## 2022-05-31 MED ORDER — MORPHINE SULFATE (PF) 4 MG/ML IV SOLN
4.0000 mg | Freq: Once | INTRAVENOUS | Status: AC
  Administered 2022-05-31: 4 mg via INTRAMUSCULAR
  Filled 2022-05-31: qty 1

## 2022-05-31 MED ORDER — CEFDINIR 300 MG PO CAPS: 300.0000 mg | ORAL_CAPSULE | Freq: Two times a day (BID) | ORAL | 0 refills | Status: AC

## 2022-05-31 MED ORDER — HYDROCODONE-ACETAMINOPHEN 5-325 MG PO TABS: 1.0000 | ORAL_TABLET | Freq: Four times a day (QID) | ORAL | 0 refills | Status: DC | PRN

## 2022-05-31 NOTE — ED Triage Notes (Signed)
R sided flank pain. Reports known kidney stones to that side. Reports pain acutely onset this AM ~0400. Pt reports able to urinate and has provided sample. Pt tearful in triage. Pt alert and oriented. Breathing unlabored with symmetric chest rise and fall.

## 2022-05-31 NOTE — Discharge Instructions (Signed)
Follow-up with your primary care provider or your urologist as needed.  Begin taking the antibiotic until completely finished.  Increase fluids.  A prescription for pain medication and Zofran for nausea was also sent to the pharmacy to take as needed.

## 2022-05-31 NOTE — ED Provider Notes (Signed)
[facility] Provider Note    Event Date/Time   First MD Initiated Contact with Patient 05/31/22 0801     (approximate)   History   Flank Pain   HPI  Candace Joseph is a 56 y.o. female   presents to the ED with complaint of right flank pain that began during the night and has gotten worse.  Patient has a history of stones and had lithotripsy recently.  Patient was given morphine while in triage and states that she did get relief from that.  She denies any fever, chills, nausea or vomiting.  Has a history of Hashimoto's thyroiditis, vitiligo and kidney stones.      Physical Exam   Triage Vital Signs: ED Triage Vitals  Enc Vitals Group     BP 05/31/22 0540 (!) 149/84     Pulse Rate 05/31/22 0540 80     Resp 05/31/22 0540 18     Temp 05/31/22 0540 97.6 F (36.4 C)     Temp Source 05/31/22 0540 Oral     SpO2 05/31/22 0540 98 %     Weight 05/31/22 0541 196 lb (88.9 kg)     Height 05/31/22 0541 5' 5"  (1.651 m)     Head Circumference --      Peak Flow --      Pain Score 05/31/22 0554 10     Pain Loc --      Pain Edu? --      Excl. in [place]? --     Most recent vital signs: Vitals:   05/31/22 0540 05/31/22 0841  BP: (!) 149/84 107/74  Pulse: 80 (!) 56  Resp: 18 18  Temp: 97.6 F (36.4 C) (!) 97.5 F (36.4 C)  SpO2: 98% 94%     General: Awake, no distress.  Patient appears to be comfortable at this time. CV:  Good peripheral perfusion.  Resp:  Normal effort.  Lungs are clear bilaterally. Abd:  No distention.  Minimal flank pain to percussion at this time. Other:     ED Results / Procedures / Treatments   Labs (all labs ordered are listed, but only abnormal results are displayed) Labs Reviewed  BASIC METABOLIC PANEL - Abnormal; Notable for the following components:      Result Value   Glucose, Bld 104 (*)    All other components within normal limits  URINALYSIS, ROUTINE W REFLEX MICROSCOPIC - Abnormal; Notable for the following  components:   Color, Urine STRAW (*)    APPearance HAZY (*)    Leukocytes,Ua MODERATE (*)    WBC, UA >50 (*)    All other components within normal limits  CBC WITH DIFFERENTIAL/PLATELET     RADIOLOGY  CT renal stone study did show numerous small calculi in the ureter and a 4 mm stone, right renal.  No obstruction.   PROCEDURES:  Critical Care performed:   Procedures   MEDICATIONS ORDERED IN ED: Medications  morphine (PF) 4 MG/ML injection 4 mg (4 mg Intramuscular Given 05/31/22 0605)  ondansetron (ZOFRAN-ODT) disintegrating tablet 4 mg (4 mg Oral Given 05/31/22 5465)     IMPRESSION / MDM / ASSESSMENT AND PLAN / ED COURSE  I reviewed the triage vital signs and the nursing notes.   Differential diagnosis includes, but is not limited to, kidney stone, urinary tract infection, pain secondary to recent lithotripsy.  56 year old female presents to the ED with complaint of right flank pain that began approximately 4 AM this morning.  Urinalysis  showed greater than 50 WBCs and 0-5 RBCs, met be was essentially normal and CBC unremarkable.  This in the CT scan results was discussed with patient.  Patient has had a urinary tract infection in the past but has been a long time.  She does not have any symptoms of cystitis.  Patient was started on Omnicef and given a prescription for Zofran as needed for nausea if this develops.  A prescription for hydrocodone was sent to the pharmacy to take as needed for pain.  She is to follow-up with her urologist if any continued problems and return to the emergency department if any severe worsening of her symptoms.      Patient's presentation is most consistent with acute complicated illness / injury requiring diagnostic workup.  FINAL CLINICAL IMPRESSION(S) / ED DIAGNOSES   Final diagnoses:  Acute urinary tract infection  Right flank pain     Rx / DC Orders   ED Discharge Orders          Ordered    cefdinir (OMNICEF) 300 MG capsule  2  times daily        05/31/22 0829    ondansetron (ZOFRAN-ODT) 4 MG disintegrating tablet  Every 8 hours PRN        05/31/22 0829    HYDROcodone-acetaminophen (NORCO/VICODIN) 5-325 MG tablet  Every 6 hours PRN        05/31/22 0321             Note:  This document was prepared using Dragon voice recognition software and may include unintentional dictation errors.   [name], PA-C 05/31/22 1503    Vanessa [place], MD 06/01/22 [phone]

## 2022-06-04 ENCOUNTER — Other Ambulatory Visit: Payer: BC Managed Care – PPO

## 2022-06-04 NOTE — Progress Notes (Signed)
06/05/22 11:37 PM   Candace Joseph 22-Feb-1966 [id number]  Referring provider:  McLean-Scocuzza, Pasty Spillers, MD [address] [place],  [place] [address]  Urological history  Nephrolithiasis  - A CT was ordered on 10/03/2021 by her PCP for her persistent back pain, and showed a 5 mm nonobstructing right mid pole renal stone - Underwent ESWL on 10/17/2021 for right renal stone with Dr. Lonna Cobb.  - KUB on 11/11/2021 visualized renal stone no longer visualized. Lower abdominal and pelvic calcifications consistent phleboliths  -CT renal stone study (05/2022) - 4 mm stone identified within the upper pole collecting system of the right kidney  Chief Complaint  Patient presents with   Nephrolithiasis      HPI: Candace Joseph is a 56 y.o.female who presents today for follow up after being seen in the ED.  At her follow-up visit on May 15, 2022, she had been having some right lower back pain that started over the last week. She was with her parents over the weekend and was engaged in a lot heavy lifting.  She is hoping that this is not another kidney stone. She is having dysuria on an intermittent basis.  UA benign.  KUB demonstrates a 3 mm and 2 mm right mid renal calculus.  Urine culture was negative.  She presented to the ED on September 2 with a complaint of right-sided flank pain.  UA showed greater than 50 WBCs and CT scan was negative for any obstructing stones.  CBC and BMP were normal.  She was given Omnicef and instructed to follow-up with [place].  Today, she mostly feels well.  She does have some mild nausea from time to time.  She is taking the [facility] as prescribed by the ED.  Patient denies any modifying or aggravating factors.  Patient denies any gross hematuria, dysuria or suprapubic/flank pain.  Patient denies any fevers, chills, nausea or vomiting.    UA benign  PMH: Past Medical History:  Diagnosis Date   COVID-19    10/23/20   Flu    2022   PONV (postoperative nausea and  vomiting)    Right kidney stone    Sinusitis    07/29/21 sinusitis + flu seen tx'ed urgent care tamiflu and augmentin   Vitiligo    arms, legs FH vitiligo    Surgical History: Past Surgical History:  Procedure Laterality Date   ABDOMINAL HYSTERECTOMY     for endometriosis no h/o abnormal total ovaries uterus,cervix out    BREAST REDUCTION SURGERY     2001   CESAREAN SECTION     2000   CHOLECYSTECTOMY     EXTRACORPOREAL SHOCK WAVE LITHOTRIPSY Right 10/17/2021   Procedure: EXTRACORPOREAL SHOCK WAVE LITHOTRIPSY (ESWL);  Surgeon: Riki Altes, MD;  Location: [facility] ORS;  Service: Urology;  Laterality: Right;   REDUCTION MAMMAPLASTY Bilateral 2001    Home Medications:  Allergies as of 06/05/2022   No Known Allergies      Medication List        Accurate as of June 05, 2022 11:37 PM. If you have any questions, ask your nurse or doctor.          cefdinir 300 MG capsule Commonly known as: OMNICEF Take 1 capsule (300 mg total) by mouth 2 (two) times daily for 10 days.   cetirizine 10 MG tablet Commonly known as: ZYRTEC Take 1 tablet (10 mg total) by mouth daily as needed for allergies.   HYDROcodone-acetaminophen 5-325 MG tablet Commonly known as: NORCO/VICODIN  Take 1 tablet by mouth every 6 (six) hours as needed.   ondansetron 4 MG disintegrating tablet Commonly known as: ZOFRAN-ODT Take 1 tablet (4 mg total) by mouth every 8 (eight) hours as needed for nausea or vomiting.        Allergies:  No Known Allergies  Family History: Family History  Problem Relation Age of Onset   Heart disease Mother    Thyroid disease Mother        hypothyroidism   Heart disease Father    Hearing loss Father    Stroke Father    Kidney Stones Father    Birth defects Maternal Grandfather    Heart disease Maternal Grandfather    Stroke Maternal Grandfather    Birth defects Paternal Grandmother    Heart disease Paternal Grandfather    Kidney Stones Son    Breast cancer  Neg Hx     Social History:  reports that she has never smoked. She has never used smokeless tobacco. She reports that she does not currently use alcohol. She reports that she does not use drugs.   Physical Exam: BP (!) 92/56   Pulse 75   Wt 199 lb (90.3 kg)   BMI 33.12 kg/m   Constitutional:  Well nourished. Alert and oriented, No acute distress. HEENT: [place] AT, moist mucus membranes.  Trachea midline Cardiovascular: No clubbing, cyanosis, or edema. Respiratory: Normal respiratory effort, no increased work of breathing. Neurologic: Grossly intact, no focal deficits, moving all 4 extremities. Psychiatric: Normal mood and affect.    Laboratory Data: Lab Results  Component Value Date   CREATININE 0.85 05/31/2022   CBC    Component Value Date/Time   WBC 6.7 05/31/2022 0544   RBC 4.68 05/31/2022 0544   HGB 13.7 05/31/2022 0544   HCT 43.9 05/31/2022 0544   PLT 329 05/31/2022 0544   MCV 93.8 05/31/2022 0544   MCH 29.3 05/31/2022 0544   MCHC 31.2 05/31/2022 0544   RDW 13.1 05/31/2022 0544   LYMPHSABS 1.8 05/31/2022 0544   MONOABS 0.5 05/31/2022 0544   EOSABS 0.3 05/31/2022 0544   BASOSABS 0.1 05/31/2022 0544    CMP     Component Value Date/Time   NA 136 05/31/2022 0544   K 4.2 05/31/2022 0544   CL 104 05/31/2022 0544   CO2 27 05/31/2022 0544   GLUCOSE 104 (H) 05/31/2022 0544   BUN 18 05/31/2022 0544   CREATININE 0.85 05/31/2022 0544   CREATININE 0.88 03/28/2020 0952   CALCIUM 8.9 05/31/2022 0544   PROT 7.6 03/31/2022 0733   ALBUMIN 4.3 03/31/2022 0733   AST 17 03/31/2022 0733   ALT 16 03/31/2022 0733   ALKPHOS 97 03/31/2022 0733   BILITOT 0.7 03/31/2022 0733   GFRNONAA >60 05/31/2022 0544      Latest Ref Rng & Units 05/31/2022    5:44 AM 03/31/2022    7:33 AM 09/09/2021    4:24 PM  BMP  Glucose 70 - 99 mg/dL 742  91  89   BUN 6 - 20 mg/dL 18  13  13    Creatinine 0.44 - 1.00 mg/dL  5.95  6.38   Sodium 135 - 145 mmol/L 136  137  134   Potassium 3.5 - 5.1  mmol/L 4.2  3.9  3.9   Chloride 98 - 111 mmol/L 104  103  97   CO2 22 - 32 mmol/L 27  25  29    Calcium 8.9 - 10.3 mg/dL 8.9  9.1  9.1  Urinalysis Results for orders placed or performed in visit on 06/05/22  Microscopic Examination   Urine  Result Value Ref Range   WBC, UA 0-5 0 - 5 /hpf   RBC, Urine 0-2 0 - 2 /hpf   Epithelial Cells (non renal) 0-10 0 - 10 /hpf   Bacteria, UA None seen None seen/Few  Urinalysis, Complete  Result Value Ref Range   Specific Gravity, UA 1.010 1.005 - 1.030   pH, UA 5.5 5.0 - 7.5   Color, UA Yellow Yellow   Appearance Ur Clear Clear   Leukocytes,UA Negative Negative   Protein,UA Negative Negative/Trace   Glucose, UA Negative Negative   Ketones, UA Negative Negative   RBC, UA Negative Negative   Bilirubin, UA Negative Negative   Urobilinogen, Ur 0.2 0.2 - 1.0 mg/dL   Nitrite, UA Negative Negative   Microscopic Examination See below:   I have reviewed the labs.   Pertinent Imaging: CLINICAL DATA:  Flank pain.  Evaluate for kidney stone.   EXAM: CT ABDOMEN AND PELVIS WITHOUT CONTRAST   TECHNIQUE: Multidetector CT imaging of the abdomen and pelvis was performed following the standard protocol without IV contrast.   RADIATION DOSE REDUCTION: This exam was performed according to the departmental dose-optimization program which includes automated exposure control, adjustment of the mA and/or kV according to patient size and/or use of iterative reconstruction technique.   COMPARISON:  10/03/2021   FINDINGS: Lower chest: No acute abnormality.   Hepatobiliary: No focal liver lesion. Status post cholecystectomy. No bile duct dilatation.   Pancreas: Unremarkable. No pancreatic ductal dilatation or surrounding inflammatory changes.   Spleen: Normal in size without focal abnormality.   Adrenals/Urinary Tract: Normal adrenal glands.   4 mm stone identified within the upper pole collecting system of the right kidney. No left renal calculi  identified. No hydronephrosis or suspicious mass identified bilaterally. No hydroureter or ureteral lithiasis identified bilaterally. There are multiple small calcifications identified along the course of the right ureter which are unchanged from the previous exam and are favored to represent scattered gonadal vein phleboliths. Urinary bladder is unremarkable.   Stomach/Bowel: Stomach appears within normal limits. The appendix is visualized and appears normal. No bowel wall thickening, inflammation, or distension. Mild to moderate stool burden noted throughout the colon.   Vascular/Lymphatic: Aortic atherosclerosis without aneurysm. No signs of abdominopelvic adenopathy.   Reproductive: Status post hysterectomy. No adnexal masses.   Other: No free fluid or fluid collections.   Musculoskeletal: No acute or significant osseous findings.   IMPRESSION: 1. No acute findings within the abdomen or pelvis. 2. Nonobstructing right renal calculus. 3. Aortic Atherosclerosis (ICD10-I70.0).     Electronically Signed   By: Kerby Moors M.D.   On: 05/31/2022 06:35  I have independently reviewed the films.  See HPI.    Assessment & Plan:    1. Right renal stone -seen on recent CT renal stone study -Not likely the cause of her pain and visit to the ED -It is possible that a another right ureteral stone was the culprit and she has since passed it  2.  Pyelonephritis -Clinical diagnosis as cultures not sent from the ED -She will complete antibiotics  Return in about 2 weeks (around 06/19/2022) for UA and symptom recheck .  Zara Council, PA-C   [facility] [address], [place], [place] [address] [phone]

## 2022-06-05 ENCOUNTER — Encounter: Payer: Self-pay | Admitting: Urology

## 2022-06-05 ENCOUNTER — Ambulatory Visit: Payer: BC Managed Care – PPO | Admitting: Urology

## 2022-06-05 VITALS — BP 92/56 | HR 75 | Wt 199.0 lb

## 2022-06-05 DIAGNOSIS — N200 Calculus of kidney: Secondary | ICD-10-CM | POA: Diagnosis not present

## 2022-06-05 DIAGNOSIS — R109 Unspecified abdominal pain: Secondary | ICD-10-CM | POA: Diagnosis not present

## 2022-06-05 DIAGNOSIS — N12 Tubulo-interstitial nephritis, not specified as acute or chronic: Secondary | ICD-10-CM

## 2022-06-05 LAB — URINALYSIS, COMPLETE
Bilirubin, UA: NEGATIVE
Glucose, UA: NEGATIVE
Ketones, UA: NEGATIVE
Leukocytes,UA: NEGATIVE
Nitrite, UA: NEGATIVE
Protein,UA: NEGATIVE
RBC, UA: NEGATIVE
Specific Gravity, UA: 1.01 (ref 1.005–1.030)
Urobilinogen, Ur: 0.2 mg/dL (ref 0.2–1.0)
pH, UA: 5.5 (ref 5.0–7.5)

## 2022-06-05 LAB — MICROSCOPIC EXAMINATION: Bacteria, UA: NONE SEEN

## 2022-06-23 NOTE — Progress Notes (Deleted)
06/23/22 1:35 PM   Candace Joseph [birth year] [id number]  Referring provider:  McLean-Scocuzza, Candace Glow, MD [place],  [place] [address]  Urological history  Nephrolithiasis  - A CT was ordered on 10/03/2021 by her PCP for her persistent back pain, and showed a 5 mm nonobstructing right mid pole renal stone - Underwent ESWL on 10/17/2021 for right renal stone with Dr. Bernardo Heater.  - KUB on 11/11/2021 visualized renal stone no longer visualized. Lower abdominal and pelvic calcifications consistent phleboliths  -CT renal stone study (05/2022) - 4 mm stone identified within the upper pole collecting system of the right kidney  No chief complaint on file.     HPI: Candace Joseph is a 56 y.o.female who presents today for follow up after being seen in the ED and treated for pyelonephritis.  UA ***   PMH: Past Medical History:  Diagnosis Date   COVID-19    10/23/20   Flu    2022   PONV (postoperative nausea and vomiting)    Right kidney stone    Sinusitis    07/29/21 sinusitis + flu seen tx'ed urgent care tamiflu and augmentin   Vitiligo    arms, legs FH vitiligo    Surgical History: Past Surgical History:  Procedure Laterality Date   ABDOMINAL HYSTERECTOMY     for endometriosis no h/o abnormal total ovaries uterus,cervix out    BREAST REDUCTION SURGERY     2001   [place] LITHOTRIPSY Right 10/17/2021   Procedure: EXTRACORPOREAL SHOCK WAVE LITHOTRIPSY (ESWL);  Surgeon: Abbie Sons, MD;  Location: [facility] ORS;  Service: Urology;  Laterality: Right;   REDUCTION MAMMAPLASTY Bilateral 2001    Home Medications:  Allergies as of 06/24/2022   No Known Allergies      Medication List        Accurate as of June 23, 2022  1:35 PM. If you have any questions, ask your nurse or doctor.          cetirizine 10 MG tablet Commonly known as: ZYRTEC Take 1 tablet (10 mg total) by mouth daily as  needed for allergies.   HYDROcodone-acetaminophen 5-325 MG tablet Commonly known as: NORCO/VICODIN Take 1 tablet by mouth every 6 (six) hours as needed.   ondansetron 4 MG disintegrating tablet Commonly known as: ZOFRAN-ODT Take 1 tablet (4 mg total) by mouth every 8 (eight) hours as needed for nausea or vomiting.        Allergies:  No Known Allergies  Family History: Family History  Problem Relation Age of Onset   Heart disease Mother    Thyroid disease Mother        hypothyroidism   Heart disease Father    Hearing loss Father    Stroke Father    Kidney Stones Father    Birth defects Maternal Grandfather    Heart disease Maternal Grandfather    Stroke Maternal Grandfather    Birth defects Paternal Grandmother    Heart disease Paternal Grandfather    Kidney Stones Son    Breast cancer Neg Hx     Social History:  reports that she has never smoked. She has never used smokeless tobacco. She reports that she does not currently use alcohol. She reports that she does not use drugs.   Physical Exam: There were no vitals taken for this visit.  Constitutional:  Well nourished. Alert and oriented, No acute distress.  HEENT: [place] AT, moist mucus membranes.  Trachea midline Cardiovascular: No clubbing, cyanosis, or edema. Respiratory: Normal respiratory effort, no increased work of breathing. Neurologic: Grossly intact, no focal deficits, moving all 4 extremities. Psychiatric: Normal mood and affect.    Laboratory Data: Urinalysis *** I have reviewed the labs.   Pertinent imaging ***   Assessment & Plan:    1. Right renal stone -seen on recent CT renal stone study -Not likely the cause of her pain and visit to the ED -It is possible that a another right ureteral stone was the culprit and she has since passed it  2.  Pyelonephritis -Clinical diagnosis as cultures not sent from the ED -She will complete antibiotics  No follow-ups on file.  Tomoko Sandra, [place] [address] [place], [place] [address] [phone]

## 2022-06-24 ENCOUNTER — Ambulatory Visit: Payer: BC Managed Care – PPO | Admitting: Urology

## 2022-07-01 ENCOUNTER — Encounter: Payer: BC Managed Care – PPO | Admitting: Family Medicine

## 2022-09-24 ENCOUNTER — Encounter: Payer: Self-pay | Admitting: Family Medicine

## 2022-09-24 ENCOUNTER — Ambulatory Visit: Payer: BC Managed Care – PPO | Admitting: Family Medicine

## 2022-09-24 VITALS — BP 122/72 | HR 77 | Temp 98.4°F | Ht 65.0 in | Wt 193.0 lb

## 2022-09-24 DIAGNOSIS — E559 Vitamin D deficiency, unspecified: Secondary | ICD-10-CM | POA: Diagnosis not present

## 2022-09-24 DIAGNOSIS — N200 Calculus of kidney: Secondary | ICD-10-CM

## 2022-09-24 DIAGNOSIS — R739 Hyperglycemia, unspecified: Secondary | ICD-10-CM | POA: Diagnosis not present

## 2022-09-24 DIAGNOSIS — E063 Autoimmune thyroiditis: Secondary | ICD-10-CM

## 2022-09-24 DIAGNOSIS — L80 Vitiligo: Secondary | ICD-10-CM | POA: Diagnosis not present

## 2022-09-24 DIAGNOSIS — Z13220 Encounter for screening for lipoid disorders: Secondary | ICD-10-CM | POA: Insufficient documentation

## 2022-09-24 NOTE — Assessment & Plan Note (Signed)
Chronic.  Recent CT renal study shows 4 mm stone in upper pole of the collecting system of right kidney. Currently asymptomatic. Okay for Azo cranberry tablets daily for prevention of recurrent UTIs Follow up with urology as scheduled.

## 2022-09-24 NOTE — Patient Instructions (Signed)
It was a pleasure meeting you today. Thank you for allowing me to take part in your health care.  Our goals for today as we discussed include:  Please schedule fasting lab appointment 1 week prior to your next annual visit in July.  Will need to fast for 10 to 12 hours prior to the appointment.  Recommended calcium intake daily 1200 mg Recommended vitamin D intake 800 international units daily.  Continue current vitamin D supplements until labs drawn at next visit.  Okay to take Azo with cranberry juice for recurrent UTIs. Follow-up with urology as needed.  Referral was previously placed in May 2023 for bone scan.  Please call to schedule appointment.  [facility] [address] [place], [place] [address] [phone]    If you have any questions or concerns, please do not hesitate to call the office at [phone].  I look forward to our next visit and until then take care and stay safe.  Regards,   Dana Allan, MD   [facility]

## 2022-09-24 NOTE — Assessment & Plan Note (Signed)
Vitamin D supplementation 4000 to 5000 units daily. Continue current medication Will repeat levels at next visit

## 2022-09-24 NOTE — Progress Notes (Signed)
SUBJECTIVE:   Chief Complaint  Patient presents with   Establish Care    Transfer of Care   HPI Patient presents to clinic for transfer of care  No acute concerns  Hashimoto's thyroiditis Asymptomatic.  Not currently on medication.  Recent workup included elevated TPO, normal TSH, T4.  Previously had ultrasound thyroid which showed mild diffuse heterogenous thyroid parenchyma without nodules.  Previously discussed with prior PCP and decision was made for continued monitoring.  Patient has not have any symptoms.  Vitiligo Noted on anterior hands and arms.  Patient reports grandmother had history of same.  No other autoimmune disorders in family.  Nephrolithiasis. Reports right kidney stone. Recent CT from 10/03/2021 shows a 5 mm nonobstructing right midpole renal stone.  Seen by Dr. Lonna Cobb and ESWL 01/23.  Repeat KUB in February did not visualize a renal stone at that time.  In September 23 she had a CT renal study which was significant for 4 mm stone in the upper pole collecting system of the right kidney.  Follows with urology.  Reports recurrent UTIs and recently treated with 10-day course of cefdinir.  She is wondering if Azo cranberry tablets would be helpful in preventing UTIs.   PERTINENT PMH / PSH: Hashimoto's thyroiditis Vitamin D deficiency Vitiligo  OBJECTIVE:  BP 122/72   Pulse 77   Temp 98.4 F (36.9 C)   Ht 5\' 5"  (1.651 m)   Wt 193 lb (87.5 kg)   SpO2 98%   BMI 32.12 kg/m    Physical Exam Vitals reviewed.  Constitutional:      General: She is not in acute distress.    Appearance: She is not ill-appearing.  HENT:     Head: Normocephalic.     Nose: Nose normal.  Eyes:     Conjunctiva/sclera: Conjunctivae normal.  Neck:     Thyroid: No thyroid mass, thyromegaly or thyroid tenderness.  Cardiovascular:     Rate and Rhythm: Normal rate and regular rhythm.     Heart sounds: Normal heart sounds.  Pulmonary:     Effort: Pulmonary effort is normal.      Breath sounds: Normal breath sounds.  Abdominal:     General: Abdomen is flat. Bowel sounds are normal.     Palpations: Abdomen is soft.  Musculoskeletal:        General: Normal range of motion.     Cervical back: Normal range of motion.  Neurological:     Mental Status: She is alert and oriented to person, place, and time. Mental status is at baseline.  Psychiatric:        Mood and Affect: Mood normal.        Behavior: Behavior normal.        Thought Content: Thought content normal.        Judgment: Judgment normal.     ASSESSMENT/PLAN:  Vitamin D deficiency Assessment & Plan: Vitamin D supplementation 4000 to 5000 units daily. Continue current medication Will repeat levels at next visit  Orders: -     VITAMIN D 25 Hydroxy (Vit-D Deficiency, Fractures); Future  Hashimoto's thyroiditis Assessment & Plan: Chronic.  Asymptomatic.  Orders: -     Comprehensive metabolic panel; Future -     CBC with Differential/Platelet; Future -     TSH; Future -     Vitamin B12; Future  Vitiligo Assessment & Plan: Chronic.  Patient reports not bothersome to her right now. Will continue to monitor. Could consider referral to dermatology or endocrinology if  worsening.   Hyperglycemia -     Hemoglobin A1c; Future  Lipid screening -     Lipid panel; Future  Nephrolithiasis Assessment & Plan: Chronic.  Recent CT renal study shows 4 mm stone in upper pole of the collecting system of right kidney. Currently asymptomatic. Okay for Azo cranberry tablets daily for prevention of recurrent UTIs Follow up with urology as scheduled.   HCM Mammogram scheduled for February 28/2024 Colonoscopy due 2028 Pap not indicated secondary to total hysterectomy Recommend bone density screening for estrogen deficiency Tdap up-to-date, due 2032 Declined flu vaccine Declined COVID-vaccine Shingles up-to-date Declined HIV screening  PDMP reviewed  Return in about 7 months (around 04/25/2023) for  annual visit with fasting labs 1 week prior.  Dana Allan, MD

## 2022-09-24 NOTE — Assessment & Plan Note (Signed)
Chronic. Asymptomatic.

## 2022-09-24 NOTE — Assessment & Plan Note (Addendum)
Chronic.  Patient reports not bothersome to her right now. Will continue to monitor. Could consider referral to dermatology or endocrinology if worsening.

## 2022-10-08 IMAGING — MG MM DIGITAL SCREENING BILAT W/ TOMO AND CAD
8 series · 8 of 24 positions shown · non-contrast
Comparison: Previous exam(s).

CLINICAL DATA: Screening.

EXAM:
DIGITAL SCREENING BILATERAL MAMMOGRAM WITH TOMOSYNTHESIS AND CAD
TECHNIQUE: Bilateral screening digital craniocaudal and mediolateral oblique
mammograms were obtained. Bilateral screening digital breast
tomosynthesis was performed. The images were evaluated with
computer-aided detection.

[R MLO synth-2D]
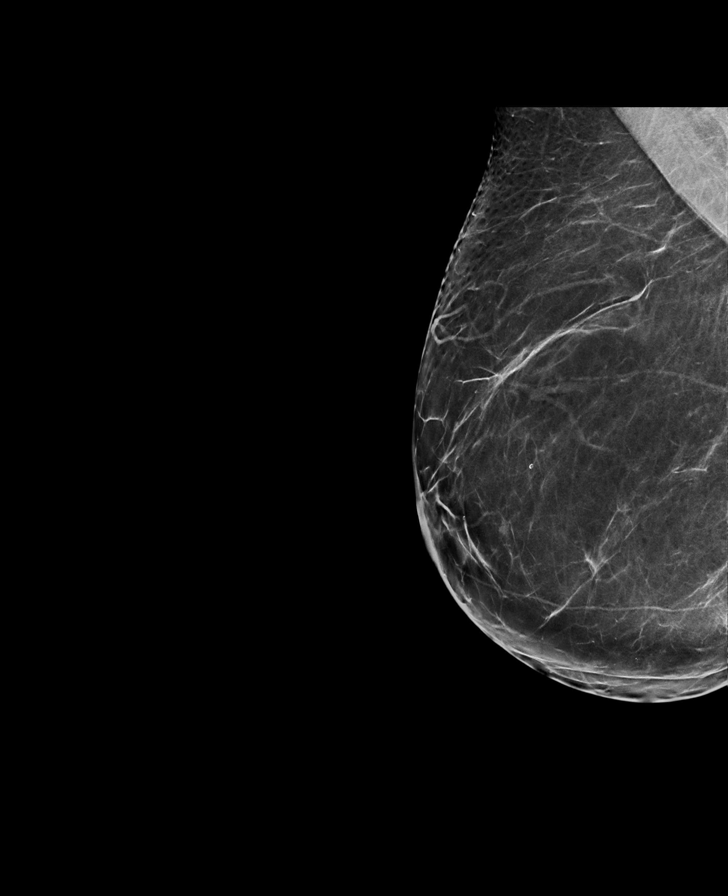

[L MLO synth-2D]
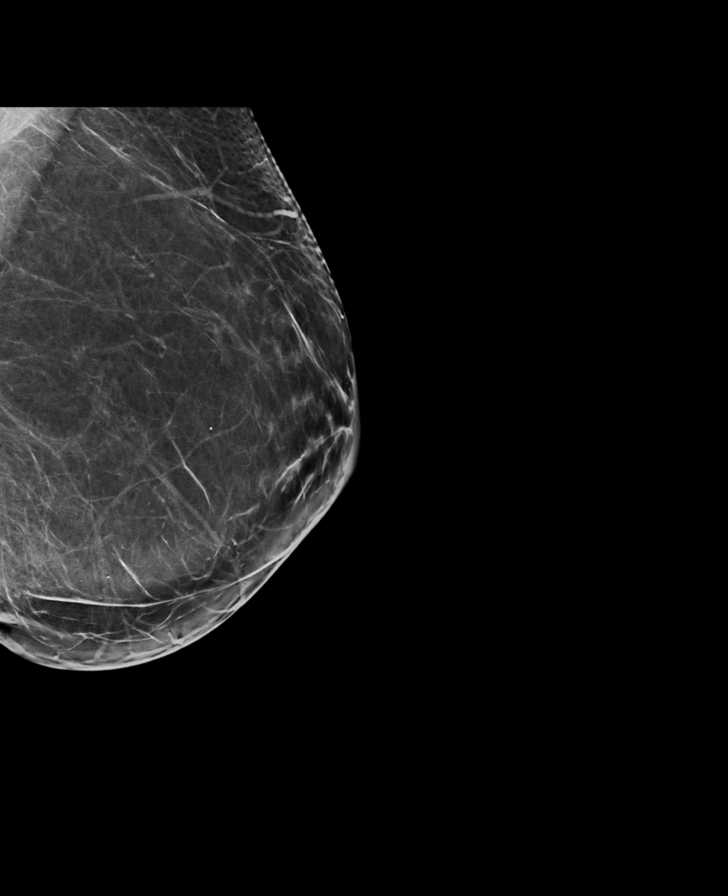

[L CC synth-2D]
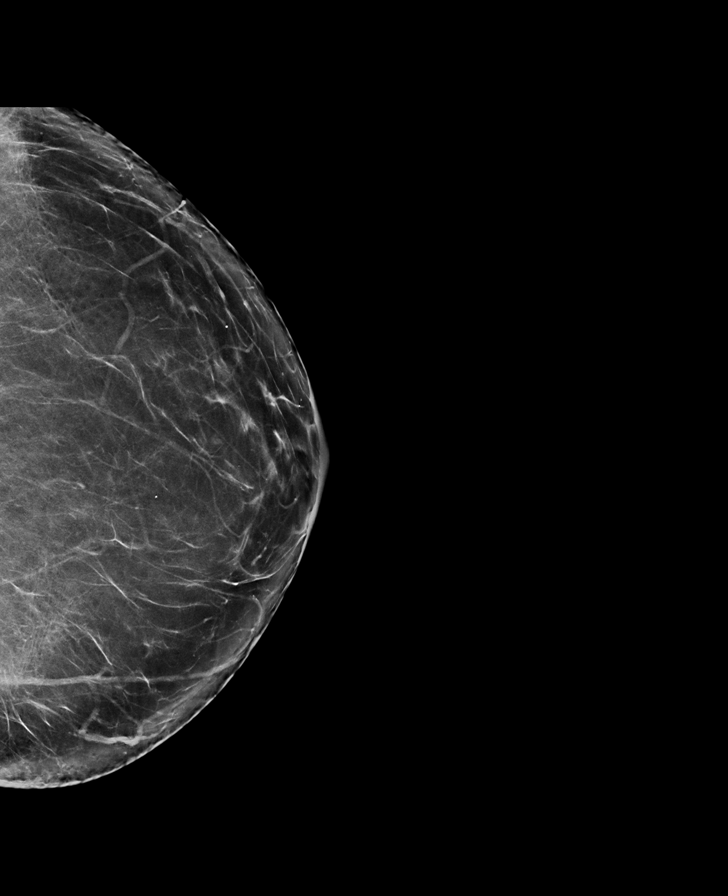

[R CC synth-2D]
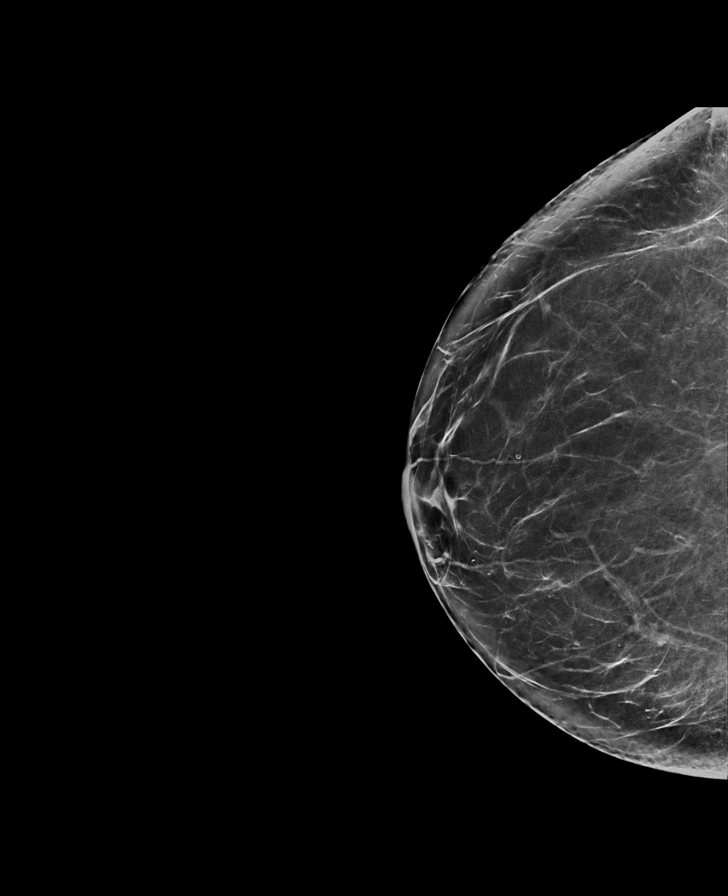

[L CC tomo · tomo slice 41/82.0]
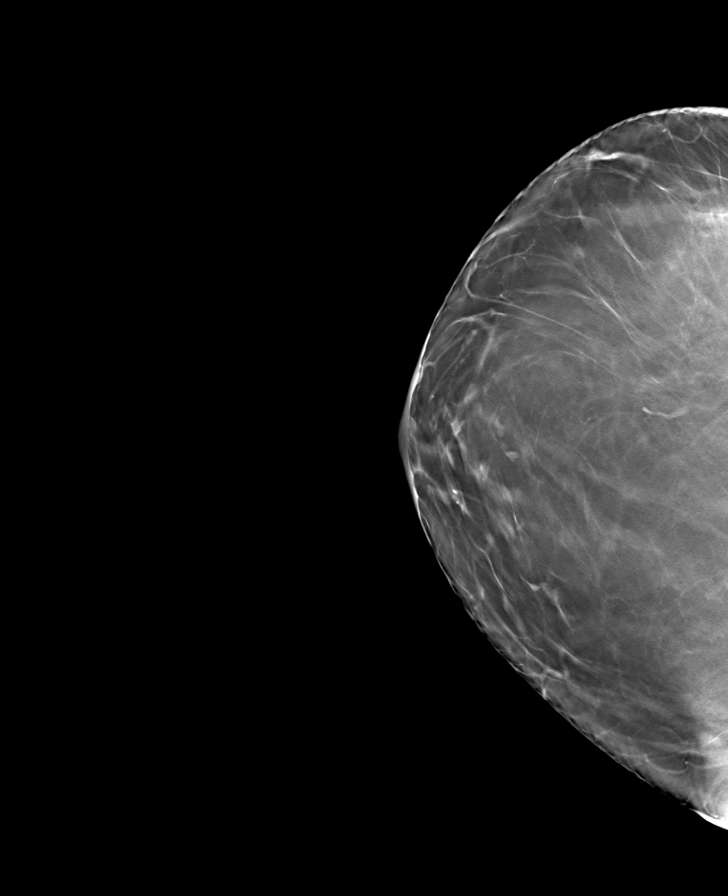

[R MLO tomo · tomo slice 43/84.0]
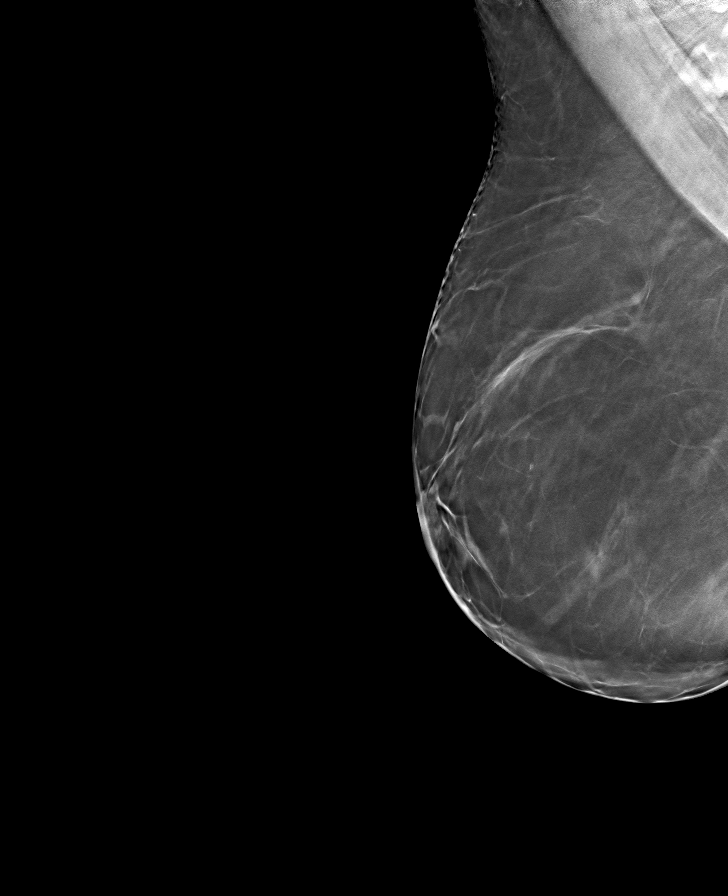

[L MLO tomo · tomo slice 43/84.0]
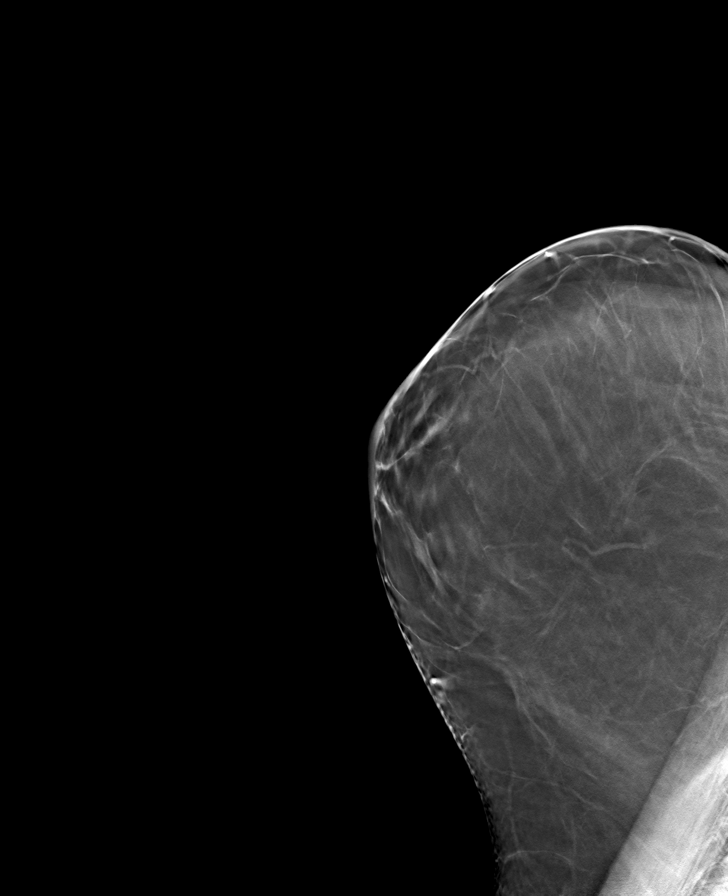

[R CC tomo · tomo slice 39/77.0]
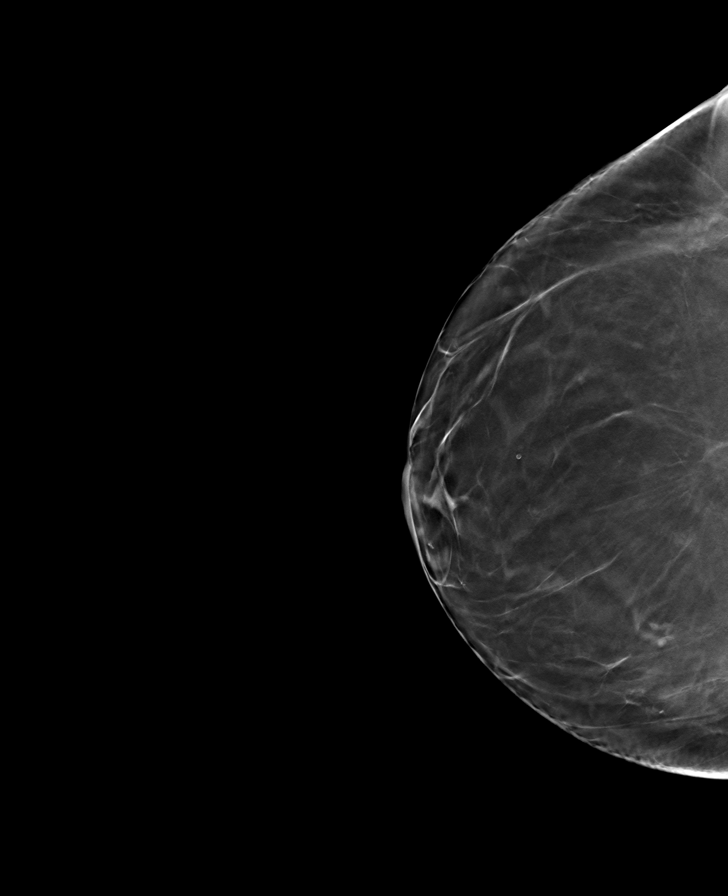

[8 of 24 positions shown; findings below may reference images not displayed]

ACR Breast Density Category b: There are scattered areas of
fibroglandular density.
FINDINGS: There are no findings suspicious for malignancy.
IMPRESSION: No mammographic evidence of malignancy. A result letter of this
screening mammogram will be mailed directly to the patient.

RECOMMENDATION:
Screening mammogram in one year. (Code:51-O-LD2)

BI-RADS CATEGORY  1: Negative.

## 2022-11-26 ENCOUNTER — Ambulatory Visit
Admission: RE | Admit: 2022-11-26 | Discharge: 2022-11-26 | Disposition: A | Discharge status: Home / Self Care | Payer: BC Managed Care – PPO | Source: Ambulatory Visit | Attending: Internal Medicine | Admitting: Internal Medicine

## 2022-11-26 DIAGNOSIS — Z1231 Encounter for screening mammogram for malignant neoplasm of breast: Secondary | ICD-10-CM

## 2022-12-08 ENCOUNTER — Other Ambulatory Visit: Payer: Self-pay | Admitting: Family Medicine

## 2022-12-08 ENCOUNTER — Telehealth: Payer: Self-pay

## 2022-12-08 ENCOUNTER — Ambulatory Visit
Admission: RE | Admit: 2022-12-08 | Discharge: 2022-12-08 | Disposition: A | Discharge status: Home / Self Care | Payer: BC Managed Care – PPO | Source: Ambulatory Visit | Attending: Urology | Admitting: Urology

## 2022-12-08 ENCOUNTER — Encounter: Payer: Self-pay | Admitting: Urology

## 2022-12-08 ENCOUNTER — Ambulatory Visit: Payer: BC Managed Care – PPO | Admitting: Urology

## 2022-12-08 ENCOUNTER — Ambulatory Visit
Admission: RE | Admit: 2022-12-08 | Discharge: 2022-12-08 | Disposition: A | Discharge status: Home / Self Care | Payer: BC Managed Care – PPO | Attending: Urology | Admitting: Urology

## 2022-12-08 ENCOUNTER — Other Ambulatory Visit
Admission: RE | Admit: 2022-12-08 | Discharge: 2022-12-08 | Disposition: A | Discharge status: Home / Self Care | Payer: BC Managed Care – PPO | Source: Home / Self Care | Attending: Urology | Admitting: Urology

## 2022-12-08 VITALS — BP 107/72 | HR 70 | Ht 65.0 in | Wt 200.0 lb

## 2022-12-08 DIAGNOSIS — N200 Calculus of kidney: Secondary | ICD-10-CM

## 2022-12-08 DIAGNOSIS — K5909 Other constipation: Secondary | ICD-10-CM | POA: Diagnosis not present

## 2022-12-08 DIAGNOSIS — R110 Nausea: Secondary | ICD-10-CM | POA: Diagnosis not present

## 2022-12-08 LAB — URINALYSIS, COMPLETE (UACMP) WITH MICROSCOPIC
Bilirubin Urine: NEGATIVE
Glucose, UA: NEGATIVE mg/dL
Hgb urine dipstick: NEGATIVE
Ketones, ur: NEGATIVE mg/dL
Leukocytes,Ua: NEGATIVE
Nitrite: POSITIVE — AB
Protein, ur: NEGATIVE mg/dL
RBC / HPF: NONE SEEN RBC/hpf (ref 0–5)
Specific Gravity, Urine: <1.005 — ABNORMAL LOW (ref 1.005–1.030)
WBC, UA: NONE SEEN WBC/hpf (ref 0–5)
pH: 6 (ref 5.0–8.0)

## 2022-12-08 NOTE — Telephone Encounter (Signed)
LMOM notified patient note sent to [facility].

## 2022-12-08 NOTE — Telephone Encounter (Signed)
Pt states she was seen today and forgot to ask for a work note.   Can she pls have one sent via my chart. She states she is very sorry she did not ask when she was there.   Pls advise.

## 2022-12-08 NOTE — Progress Notes (Signed)
06/05/22 11:37 PM   Candace Joseph 1966-07-02 [id number]  Referring provider:  McLean-Scocuzza, Nino Glow, MD [place],  [place] [address]  Urological history  Nephrolithiasis  - A CT was ordered on 10/03/2021 by her PCP for her persistent back pain, and showed a 5 mm nonobstructing right mid pole renal stone - Underwent ESWL on 10/17/2021 for right renal stone with Dr. Bernardo Heater.  - KUB on 11/11/2021 visualized renal stone no longer visualized. Lower abdominal and pelvic calcifications consistent phleboliths  -CT renal stone study (05/2022) - 4 mm stone identified within the upper pole collecting system of the right kidney  Chief Complaint  Patient presents with   Nephrolithiasis      HPI: Candace Joseph is a 57 y.o.female who presents today for possible stone.   For the last week she has been having some intense nausea.  She states that she has not had a bowel movement in over a week and she feels like there is an innertube around her lower abdomen squeezing her.  2 days ago she had the onset of right-sided lower back pain which made her think she may have a kidney stone.  She has been having some difficulty with urination with a weak urinary stream, intermittency and straining to get the urine out.    Patient denies any modifying or aggravating factors.  Patient denies any gross hematuria, dysuria or suprapubic/flank pain.  Patient denies any fevers, chills, nausea or vomiting.    KUB marked colonic distention with stool and gas making it difficult to evaluate for nephrolithiasis.  UA yellow clear, less than 1.005 for specific gravity, pH 6.0, nitrate positive, 0-5 squames epithelial cells and rare bacteria.  Patient did take Uristat overnight, so that likely explains the positive nitrate.  PMH: Past Medical History:  Diagnosis Date   COVID-19    10/23/20   Flu    2022   PONV (postoperative nausea and vomiting)    Right kidney stone    Sinusitis    07/29/21  sinusitis + flu seen tx'ed urgent care tamiflu and augmentin   Vitiligo    arms, legs FH vitiligo    Surgical History: Past Surgical History:  Procedure Laterality Date   ABDOMINAL HYSTERECTOMY     for endometriosis no h/o abnormal total ovaries uterus,cervix out    BREAST REDUCTION SURGERY     2001   [place] LITHOTRIPSY Right 10/17/2021   Procedure: EXTRACORPOREAL SHOCK WAVE LITHOTRIPSY (ESWL);  Surgeon: Abbie Sons, MD;  Location: [facility] ORS;  Service: Urology;  Laterality: Right;   REDUCTION MAMMAPLASTY Bilateral 2001    Home Medications:  Allergies as of 06/05/2022   No Known Allergies      Medication List        Accurate as of June 05, 2022 11:37 PM. If you have any questions, ask your nurse or doctor.          cefdinir 300 MG capsule Commonly known as: OMNICEF Take 1 capsule (300 mg total) by mouth 2 (two) times daily for 10 days.   cetirizine 10 MG tablet Commonly known as: ZYRTEC Take 1 tablet (10 mg total) by mouth daily as needed for allergies.   HYDROcodone-acetaminophen 5-325 MG tablet Commonly known as: NORCO/VICODIN Take 1 tablet by mouth every 6 (six) hours as needed.   ondansetron 4 MG disintegrating tablet Commonly known as: ZOFRAN-ODT Take 1 tablet (4 mg  total) by mouth every 8 (eight) hours as needed for nausea or vomiting.        Allergies:  No Known Allergies  Family History: Family History  Problem Relation Age of Onset   Heart disease Mother    Thyroid disease Mother        hypothyroidism   Heart disease Father    Hearing loss Father    Stroke Father    Kidney Stones Father    Birth defects Maternal Grandfather    Heart disease Maternal Grandfather    Stroke Maternal Grandfather    Birth defects Paternal Grandmother    Heart disease Paternal Grandfather    Kidney Stones Son    Breast cancer Neg Hx     Social History:  reports that she has never  smoked. She has never used smokeless tobacco. She reports that she does not currently use alcohol. She reports that she does not use drugs.   Physical Exam: BP (!) 92/56   Pulse 75   Wt 199 lb (90.3 kg)   BMI 33.12 kg/m   Constitutional:  Well nourished. Alert and oriented, No acute distress. HEENT: [place] AT, moist mucus membranes.  Trachea midline Cardiovascular: No clubbing, cyanosis, or edema. Respiratory: Normal respiratory effort, no increased work of breathing. Neurologic: Grossly intact, no focal deficits, moving all 4 extremities. Psychiatric: Normal mood and affect.    Laboratory Data: Urinalysis Component     Latest Ref Rng 12/08/2022  Color, Urine     YELLOW  YELLOW   Appearance     CLEAR  CLEAR   Specific Gravity, Urine     1.005 - 1.030  <1.005 (L)   pH     5.0 - 8.0  6.0   Glucose, UA     NEGATIVE mg/dL NEGATIVE   Bilirubin Urine     NEGATIVE  NEGATIVE   Ketones, ur     NEGATIVE mg/dL NEGATIVE   Hgb urine dipstick     NEGATIVE  NEGATIVE   Protein     NEGATIVE mg/dL NEGATIVE   Nitrite     NEGATIVE  POSITIVE !   Leukocytes,Ua     NEGATIVE  NEGATIVE   WBC, UA     0 - 5 WBC/hpf NONE SEEN   RBC / HPF     0 - 5 RBC/hpf NONE SEEN   Squamous Epithelial / HPF     0 - 5 /HPF 0-5   Bacteria, UA     NONE SEEN  RARE !     Legend: (L) Low ! Abnormal I have reviewed the labs.   Pertinent Imaging: CLINICAL DATA:  Right low back pain.   EXAM: ABDOMEN - 1 VIEW   COMPARISON:  05/15/2022   FINDINGS: Supine abdomen shows no gaseous small bowel dilatation to suggest small bowel obstruction. There is marked distention of the colon with stool and gas. Multiple phleboliths overlie the right sacrum and left anatomic pelvis, similar to prior surgical clips in the right upper quadrant are compatible with prior cholecystectomy. No definite kidney stone evident although expected location of the kidneys largely obscured by overlying gas and stool.   IMPRESSION: 1.  Marked colonic distention with stool and gas. Imaging features are most suggestive of clinical constipation. Distal colonic obstruction is considered less likely but not excluded. 2. No definite kidney stone evident although expected location of the kidneys largely obscured by overlying gas and stool.     Electronically Signed   By: Verda Cumins.D.  On: 12/08/2022 13:45 I have independently reviewed the films.  See HPI.    Assessment & Plan:    1.  Constipation -Advised her that some of her symptoms just may be a result of her constipation and that the constipation makes it difficult to evaluate for nephrolithiasis -We discussed pursuing a CT for further evaluation, but she deferred opting for going home and taking laxatives to expel the colon of stool and then repeating an x-ray later this week or next week  2. Nephrolithiasis -Today's KUB is nondiagnostic for nephrolithiasis secondary to the constipation -Will either repeat KUB later on this week or next week -If her symptoms worsen or are not relieved after having successful bowel movement, we will pursue CT -Reviewed return precautions  Return in about 2 weeks (around 06/19/2022) for UA and symptom recheck .   Raysa Bosak, [place] [address] [place], [place] [address] [phone]

## 2022-12-08 NOTE — Telephone Encounter (Signed)
Zara Council A, PA-C  You3 minutes ago (9:50 AM)    We should see you for an Xray and check your urine for infection.  Would you be able to come to [facility] [place] today?   LMOM to see if she will come in to [facility] today.

## 2022-12-09 LAB — URINE CULTURE: Culture: NO GROWTH

## 2022-12-10 ENCOUNTER — Encounter: Payer: Self-pay | Admitting: *Deleted

## 2023-01-30 ENCOUNTER — Telehealth: Payer: BC Managed Care – PPO | Admitting: Family Medicine

## 2023-01-30 ENCOUNTER — Telehealth: Payer: Self-pay

## 2023-01-30 DIAGNOSIS — N390 Urinary tract infection, site not specified: Secondary | ICD-10-CM | POA: Diagnosis not present

## 2023-01-30 MED ORDER — NITROFURANTOIN MONOHYD MACRO 100 MG PO CAPS: 100.0000 mg | ORAL_CAPSULE | Freq: Two times a day (BID) | ORAL | 0 refills | Status: AC

## 2023-01-30 NOTE — Telephone Encounter (Signed)
Patient states she has a possible UTI and she has taken Uristat (OTC), started 01/28/2023, but the box told her to not take more than two days without consulting her doctor.  Patient states the Frederica Kuster has helped.  Patient states she went to [facility], but they were not taking anymore appointments today.  The [facility] told her it would be a 2.5-3 hour late and she did not want to sit there with germy people.  Patient states she would like to know if she can continue to take the Uristat or if there is something else she can do.

## 2023-01-30 NOTE — Patient Instructions (Signed)
Urinary Tract Infection, Adult  A urinary tract infection (UTI) is an infection of any part of the urinary tract. The urinary tract includes the kidneys, ureters, bladder, and urethra. These organs make, store, and get rid of urine in the body. An upper UTI affects the ureters and kidneys. A lower UTI affects the bladder and urethra. What are the causes? Most urinary tract infections are caused by bacteria in your genital area around your urethra, where urine leaves your body. These bacteria grow and cause inflammation of your urinary tract. What increases the risk? You are more likely to develop this condition if: You have a urinary catheter that stays in place. You are not able to control when you urinate or have a bowel movement (incontinence). You are female and you: Use a spermicide or diaphragm for birth control. Have low estrogen levels. Are pregnant. You have certain genes that increase your risk. You are sexually active. You take antibiotic medicines. You have a condition that causes your flow of urine to slow down, such as: An enlarged prostate, if you are female. Blockage in your urethra. A kidney stone. A nerve condition that affects your bladder control (neurogenic bladder). Not getting enough to drink, or not urinating often. You have certain medical conditions, such as: Diabetes. A weak disease-fighting system (immunesystem). Sickle cell disease. Gout. Spinal cord injury. What are the signs or symptoms? Symptoms of this condition include: Needing to urinate right away (urgency). Frequent urination. This may include small amounts of urine each time you urinate. Pain or burning with urination. Blood in the urine. Urine that smells bad or unusual. Trouble urinating. Cloudy urine. Vaginal discharge, if you are female. Pain in the abdomen or the lower back. You may also have: Vomiting or a decreased appetite. Confusion. Irritability or tiredness. A fever or  chills. Diarrhea. The first symptom in older adults may be confusion. In some cases, they may not have any symptoms until the infection has worsened. How is this diagnosed? This condition is diagnosed based on your medical history and a physical exam. You may also have other tests, including: Urine tests. Blood tests. Tests for STIs (sexually transmitted infections). If you have had more than one UTI, a cystoscopy or imaging studies may be done to determine the cause of the infections. How is this treated? Treatment for this condition includes: Antibiotic medicine. Over-the-counter medicines to treat discomfort. Drinking enough water to stay hydrated. If you have frequent infections or have other conditions such as a kidney stone, you may need to see a health care provider who specializes in the urinary tract (urologist). In rare cases, urinary tract infections can cause sepsis. Sepsis is a life-threatening condition that occurs when the body responds to an infection. Sepsis is treated in the hospital with IV antibiotics, fluids, and other medicines. Follow these instructions at home:  Medicines Take over-the-counter and prescription medicines only as told by your health care provider. If you were prescribed an antibiotic medicine, take it as told by your health care provider. Do not stop using the antibiotic even if you start to feel better. General instructions Make sure you: Empty your bladder often and completely. Do not hold urine for long periods of time. Empty your bladder after sex. Wipe from front to back after urinating or having a bowel movement if you are female. Use each tissue only one time when you wipe. Drink enough fluid to keep your urine pale yellow. Keep all follow-up visits. This is important. Contact a health   care provider if: Your symptoms do not get better after 1-2 days. Your symptoms go away and then return. Get help right away if: You have severe pain in  your back or your lower abdomen. You have a fever or chills. You have nausea or vomiting. Summary A urinary tract infection (UTI) is an infection of any part of the urinary tract, which includes the kidneys, ureters, bladder, and urethra. Most urinary tract infections are caused by bacteria in your genital area. Treatment for this condition often includes antibiotic medicines. If you were prescribed an antibiotic medicine, take it as told by your health care provider. Do not stop using the antibiotic even if you start to feel better. Keep all follow-up visits. This is important. This information is not intended to replace advice given to you by your health care provider. Make sure you discuss any questions you have with your health care provider. Document Revised: 04/27/2020 Document Reviewed: 04/27/2020 Elsevier Patient Education  2023 [employer]  

## 2023-01-30 NOTE — Progress Notes (Signed)
Virtual Visit Consent   Candace Joseph, you are scheduled for a virtual visit with a [facility] provider today. Just as with appointments in the office, your consent must be obtained to participate. Your consent will be active for this visit and any virtual visit you may have with one of our providers in the next 365 days. If you have a MyChart account, a copy of this consent can be sent to you electronically.  As this is a virtual visit, video technology does not allow for your provider to perform a traditional examination. This may limit your provider's ability to fully assess your condition. If your provider identifies any concerns that need to be evaluated in person or the need to arrange testing (such as labs, EKG, etc.), we will make arrangements to do so. Although advances in technology are sophisticated, we cannot ensure that it will always work on either your end or our end. If the connection with a video visit is poor, the visit may have to be switched to a telephone visit. With either a video or telephone visit, we are not always able to ensure that we have a secure connection.  By engaging in this virtual visit, you consent to the provision of healthcare and authorize for your insurance to be billed (if applicable) for the services provided during this visit. Depending on your insurance coverage, you may receive a charge related to this service.  I need to obtain your verbal consent now. Are you willing to proceed with your visit today? Candace Joseph has provided verbal consent on 01/30/2023 for a virtual visit (video or telephone). Georgana Curio, FNP  Date: 01/30/2023 5:15 PM  Virtual Visit via Video Note   I, Georgana Curio, connected with  Candace Joseph  ([id number], 01/05/1966) on 01/30/23 at  5:15 PM EDT by a video-enabled telemedicine application and verified that I am speaking with the correct person using two identifiers.  Location: Patient: Virtual Visit Location Patient:  Home Provider: Virtual Visit Location Provider: Home Office   I discussed the limitations of evaluation and management by telemedicine and the availability of in person appointments. The patient expressed understanding and agreed to proceed.    History of Present Illness: Candace Joseph is a 57 y.o. who identifies as a female who was assigned female at birth, and is being seen today for dysuria and frequency with no fever. Last UTI in February. No abd pain. Marland Kitchen  HPI: HPI  Problems:  Patient Active Problem List   Diagnosis Date Noted   Hyperglycemia 09/24/2022   Lipid screening 09/24/2022   Hashimoto's thyroiditis 05/19/2022   Nephrolithiasis 10/04/2021   Vitamin D deficiency 04/01/2021   Vitiligo 08/13/2018    Allergies: No Known Allergies Medications:  Current Outpatient Medications:    nitrofurantoin, macrocrystal-monohydrate, (MACROBID) 100 MG capsule, Take 1 capsule (100 mg total) by mouth 2 (two) times daily for 7 days., Disp: 14 capsule, Rfl: 0   Ascorbic Acid (VITAMIN C) 1000 MG tablet, Take 1,000 mg by mouth daily., Disp: , Rfl:    cetirizine (ZYRTEC) 10 MG tablet, Take 1 tablet (10 mg total) by mouth daily as needed for allergies., Disp: 90 tablet, Rfl: 3   Cholecalciferol (VITAMIN D-3) 125 MCG (5000 UT) TABS, Take by mouth., Disp: , Rfl:    Cranberry-Vitamin C-Probiotic (AZO CRANBERRY PO), Take by mouth., Disp: , Rfl:   Observations/Objective: Patient is well-developed, well-nourished in no acute distress.  Resting comfortably  at home.  Head is normocephalic, atraumatic.  No labored breathing.  Speech is clear and coherent with logical content.  Patient is alert and oriented at baseline.    Assessment and Plan: 1. Urinary tract infection without hematuria, site unspecified  Increase fluids, prevention discussed, UC if sx worsen.   Follow Up Instructions: I discussed the assessment and treatment plan with the patient. The patient was provided an opportunity to ask  questions and all were answered. The patient agreed with the plan and demonstrated an understanding of the instructions.  A copy of instructions were sent to the patient via MyChart unless otherwise noted below.     The patient was advised to call back or seek an in-person evaluation if the symptoms worsen or if the condition fails to improve as anticipated.  Time:  I spent 8 minutes with the patient via telehealth technology discussing the above problems/concerns.    Georgana Curio, FNP

## 2023-01-30 NOTE — Telephone Encounter (Signed)
Called and spoke with pt in regards she states she has burning, urgency , and pressure.   Spoke with Dr. Birdie Sons who is the doc of the day who advised pt be seen by atleast a virtual visit we informed her she can do a virtual visit for the urgent care through [facility] as well as log in [facility] e visits.   Pt was also informed that if she is unable to get help that she can call back on Monday and make an appointment or go to the urgent care in person

## 2023-02-03 ENCOUNTER — Encounter: Payer: Self-pay | Admitting: Family Medicine

## 2023-02-04 ENCOUNTER — Ambulatory Visit
Admission: RE | Admit: 2023-02-04 | Discharge: 2023-02-04 | Disposition: A | Discharge status: Home / Self Care | Payer: BC Managed Care – PPO | Source: Ambulatory Visit | Attending: Emergency Medicine | Admitting: Emergency Medicine

## 2023-02-04 VITALS — BP 114/81 | HR 74 | Temp 97.9°F | Resp 18

## 2023-02-04 DIAGNOSIS — R300 Dysuria: Secondary | ICD-10-CM | POA: Insufficient documentation

## 2023-02-04 DIAGNOSIS — R110 Nausea: Secondary | ICD-10-CM | POA: Insufficient documentation

## 2023-02-04 LAB — POCT URINALYSIS DIP (MANUAL ENTRY)
Bilirubin, UA: NEGATIVE
Blood, UA: NEGATIVE
Glucose, UA: NEGATIVE mg/dL
Ketones, POC UA: NEGATIVE mg/dL
Nitrite, UA: NEGATIVE
Protein Ur, POC: NEGATIVE mg/dL
Spec Grav, UA: 1.01 (ref 1.010–1.025)
Urobilinogen, UA: 0.2 E.U./dL
pH, UA: 5.5 (ref 5.0–8.0)

## 2023-02-04 MED ORDER — ONDANSETRON 4 MG PO TBDP: 4.0000 mg | ORAL_TABLET | Freq: Three times a day (TID) | ORAL | 0 refills | Status: DC | PRN

## 2023-02-04 NOTE — ED Triage Notes (Signed)
Patient to Urgent Care with complaints of dysuria/ urinary frequency/ urgency/ suprapubic pressure.  Reports symptoms started five days ago. States that she did a virtual visit on Friday and was prescribed an Macrobid for a UTI. Reports taking the medications is making her nauseated. States she doesn't feel like she's getting any better. Has also been taking probiotic.

## 2023-02-04 NOTE — Discharge Instructions (Addendum)
Take the Zofran as directed for nausea.  Finish taking the Macrobid as prescribed.  Follow up with your primary care provider if your symptoms are not improving.

## 2023-02-04 NOTE — ED Provider Notes (Signed)
Candace Joseph    CSN: [id number] Arrival date & time: 02/04/23  1711      History   Chief Complaint Chief Complaint  Patient presents with   Abdominal Pain    I did a virtual visit last Friday (5-3) and was prescribed an antibiotic for UTI. I am very nauseated and having unusual abdominal pain. Should be feeling better by now. Need assistance figuring out what is going on. - Entered by patient    HPI Candace Joseph is a 57 y.o. female.  Patient presents with dysuria, urinary frequency, urinary urgency, bladder pressure x 5 days.  She had an e-visit on 01/30/2023 and was started on Macrobid but she has not been able to take the medication as directed because of nausea.  She has been taking the Macrobid with food and taking a probiotic but still has nausea when taking it.  She denies fever, chills, abdominal pain, hematuria, vomiting, or other symptoms.    The history is provided by the patient and medical records.    Past Medical History:  Diagnosis Date   COVID-19    10/23/20   Flu    2022   PONV (postoperative nausea and vomiting)    Right kidney stone    Sinusitis    07/29/21 sinusitis + flu seen tx'ed urgent care tamiflu and augmentin   Skin lesion 08/13/2018   Vitiligo    arms, legs FH vitiligo   Well adult exam 03/27/2020    Patient Active Problem List   Diagnosis Date Noted   Hyperglycemia 09/24/2022   Lipid screening 09/24/2022   Hashimoto's thyroiditis 05/19/2022   Nephrolithiasis 10/04/2021   Vitamin D deficiency 04/01/2021   Vitiligo 08/13/2018    Past Surgical History:  Procedure Laterality Date   ABDOMINAL HYSTERECTOMY     for endometriosis no h/o abnormal total ovaries uterus,cervix out    BREAST REDUCTION SURGERY     2001   CESAREAN SECTION     2000   CHOLECYSTECTOMY     EXTRACORPOREAL SHOCK WAVE LITHOTRIPSY Right 10/17/2021   Procedure: EXTRACORPOREAL SHOCK WAVE LITHOTRIPSY (ESWL);  Surgeon: Riki Altes, MD;  Location: [facility] ORS;   Service: Urology;  Laterality: Right;   REDUCTION MAMMAPLASTY Bilateral 2001    OB History   No obstetric history on file.      Home Medications    Prior to Admission medications   Medication Sig Start Date End Date Taking? Authorizing Provider  ondansetron (ZOFRAN-ODT) 4 MG disintegrating tablet Take 1 tablet (4 mg total) by mouth every 8 (eight) hours as needed for nausea or vomiting. 02/04/23  Yes Mickie Bail, NP  Ascorbic Acid (VITAMIN C) 1000 MG tablet Take 1,000 mg by mouth daily.    [provider]  cetirizine (ZYRTEC) 10 MG tablet Take 1 tablet (10 mg total) by mouth daily as needed for allergies. 02/14/22   McLean-Scocuzza, Pasty Spillers, MD  Cholecalciferol (VITAMIN D-3) 125 MCG (5000 UT) TABS Take by mouth.    [provider]  Cranberry-Vitamin C-Probiotic (AZO CRANBERRY PO) Take by mouth.    [provider]  nitrofurantoin, macrocrystal-monohydrate, (MACROBID) 100 MG capsule Take 1 capsule (100 mg total) by mouth 2 (two) times daily for 7 days. Patient not taking: Reported on 02/04/2023 01/30/23 02/06/23  Delorse Lek, FNP    Family History Family History  Problem Relation Age of Onset   Heart disease Mother    Thyroid disease Mother        hypothyroidism  Heart disease Father    Hearing loss Father    Stroke Father    Kidney Stones Father    Birth defects Maternal Grandfather    Heart disease Maternal Grandfather    Stroke Maternal Grandfather    Birth defects Paternal Grandmother    Heart disease Paternal Grandfather    Kidney Stones Son    Breast cancer Neg Hx     Social History Social History   Tobacco Use   Smoking status: Never   Smokeless tobacco: Never  Substance Use Topics   Alcohol use: Not Currently   Drug use: Never     Allergies   Patient has no known allergies.   Review of Systems Review of Systems  Constitutional:  Negative for chills and fever.  Gastrointestinal:  Positive for nausea. Negative for abdominal  pain, constipation, diarrhea and vomiting.  Genitourinary:  Positive for dysuria, frequency and urgency. Negative for flank pain, hematuria, pelvic pain and vaginal discharge.  All other systems reviewed and are negative.    Physical Exam Triage Vital Signs ED Triage Vitals  Enc Vitals Group     BP      Pulse      Resp      Temp      Temp src      SpO2      Weight      Height      Head Circumference      Peak Flow      Pain Score      Pain Loc      Pain Edu?      Excl. in GC?    No data found.  Updated Vital Signs BP 114/81   Pulse 74   Temp 97.9 F (36.6 C)   Resp 18   SpO2 98%   Visual Acuity Right Eye Distance:   Left Eye Distance:   Bilateral Distance:    Right Eye Near:   Left Eye Near:    Bilateral Near:     Physical Exam Vitals and nursing note reviewed.  Constitutional:      General: She is not in acute distress.    Appearance: Normal appearance. She is well-developed. She is not ill-appearing.  HENT:     Mouth/Throat:     Mouth: Mucous membranes are moist.  Cardiovascular:     Rate and Rhythm: Normal rate and regular rhythm.     Heart sounds: Normal heart sounds.  Pulmonary:     Effort: Pulmonary effort is normal. No respiratory distress.     Breath sounds: Normal breath sounds.  Abdominal:     General: Bowel sounds are normal.     Palpations: Abdomen is soft.     Tenderness: There is no abdominal tenderness. There is no right CVA tenderness, left CVA tenderness, guarding or rebound.  Musculoskeletal:     Cervical back: Neck supple.  Skin:    General: Skin is warm and dry.  Neurological:     Mental Status: She is alert.  Psychiatric:        Mood and Affect: Mood normal.        Behavior: Behavior normal.      UC Treatments / Results  Labs (all labs ordered are listed, but only abnormal results are displayed) Labs Reviewed  POCT URINALYSIS DIP (MANUAL ENTRY) - Abnormal; Notable for the following components:      Result Value    Leukocytes, UA Trace (*)    All other components within normal limits  URINE CULTURE    EKG   Radiology No results found.  Procedures Procedures (including critical care time)  Medications Ordered in UC Medications - No data to display  Initial Impression / Assessment and Plan / UC Course  I have reviewed the triage vital signs and the nursing notes.  Pertinent labs & imaging results that were available during my care of the patient were reviewed by me and considered in my medical decision making (see chart for details).   Dysuria, nausea without vomiting.  Treating nausea with Zofran.  Instructed patient to take the Macrobid as prescribed but to wait 45-60 minutes after taking Zofran.  Urine culture pending but this may not be skewed as patient has sporadically taken the Macrobid since it was prescribed.  Instructed patient to follow up with her PCP if her symptoms are not improving.  She agrees to plan of care.    Final Clinical Impressions(s) / UC Diagnoses   Final diagnoses:  Dysuria  Nausea without vomiting     Discharge Instructions      Take the Zofran as directed for nausea.  Finish taking the Macrobid as prescribed.  Follow up with your primary care provider if your symptoms are not improving.        ED Prescriptions     Medication Sig Dispense Auth. Provider   ondansetron (ZOFRAN-ODT) 4 MG disintegrating tablet Take 1 tablet (4 mg total) by mouth every 8 (eight) hours as needed for nausea or vomiting. 20 tablet Mickie Bail, NP      PDMP not reviewed this encounter.   Mickie Bail, NP 02/04/23 [phone]

## 2023-02-05 LAB — URINE CULTURE: Culture: <10000 — AB

## 2023-03-25 ENCOUNTER — Ambulatory Visit
Admission: RE | Admit: 2023-03-25 | Discharge: 2023-03-25 | Disposition: A | Discharge status: Home / Self Care | Payer: BC Managed Care – PPO | Source: Ambulatory Visit | Attending: Urgent Care | Admitting: Urgent Care

## 2023-03-25 VITALS — BP 144/90 | HR 77 | Temp 98.4°F | Resp 16

## 2023-03-25 DIAGNOSIS — N3000 Acute cystitis without hematuria: Secondary | ICD-10-CM | POA: Diagnosis present

## 2023-03-25 DIAGNOSIS — R300 Dysuria: Secondary | ICD-10-CM | POA: Diagnosis present

## 2023-03-25 LAB — POCT URINALYSIS DIP (MANUAL ENTRY)
Bilirubin, UA: NEGATIVE
Blood, UA: NEGATIVE
Glucose, UA: NEGATIVE mg/dL
Ketones, POC UA: NEGATIVE mg/dL
Nitrite, UA: POSITIVE — AB
Protein Ur, POC: NEGATIVE mg/dL
Spec Grav, UA: 1.015 (ref 1.010–1.025)
Urobilinogen, UA: 0.2 E.U./dL
pH, UA: 6 (ref 5.0–8.0)

## 2023-03-25 MED ORDER — CEPHALEXIN 500 MG PO CAPS: 500.0000 mg | ORAL_CAPSULE | Freq: Four times a day (QID) | ORAL | 0 refills | Status: AC

## 2023-03-25 NOTE — Discharge Instructions (Signed)
Your urine is suggestive of a urinary tract infection.  I am treating you with an antibiotic.  We will send a sample of your urine to the lab to culture and verify the medication prescribed will adequately treat the infection.  If results indicate a need to switch antibiotics, a nurse will contact you by telephone.

## 2023-03-25 NOTE — ED Triage Notes (Signed)
Patient presents to [facility] for dysuria and urgency since Sunday. States she took [place] stat with no improvement.

## 2023-03-25 NOTE — ED Provider Notes (Signed)
[name]    CSN: [id number] Arrival date & time: 03/25/23  1148      History   Chief Complaint Chief Complaint  Patient presents with   Dysuria    same symptoms as last visit. Have taken 2 days of Uristat (OTC) meds, still burning and painful. Directions say not take for more than 2 days w/0 consult the dr.  I have annual physical w/ Dr. Clent Ridges next week but need help knocking this out, please. - Entered by patient    HPI Candace Joseph is a 57 y.o. female.    Dysuria   Presents to urgent care with concern for recurrent urinary tract infection.  She endorses dysuria, frequency, urgency, bladder pressure times 3 days.  Patient was seen on 02/04/2023 following treatment for UTI via video visit on 01/30/2023 when she was prescribed Macrobid.  Urine culture obtained on 5/8 showed insignificant growth however the patient had been taking Macrobid for several days prior to obtaining the urine sample.  Past Medical History:  Diagnosis Date   COVID-19    10/23/20   Flu    2022   PONV (postoperative nausea and vomiting)    Right kidney stone    Sinusitis    07/29/21 sinusitis + flu seen tx'ed urgent care tamiflu and augmentin   Skin lesion 08/13/2018   Vitiligo    arms, legs FH vitiligo   Well adult exam 03/27/2020    Patient Active Problem List   Diagnosis Date Noted   Hyperglycemia 09/24/2022   Lipid screening 09/24/2022   Hashimoto's thyroiditis 05/19/2022   Nephrolithiasis 10/04/2021   Vitamin D deficiency 04/01/2021   Vitiligo 08/13/2018    Past Surgical History:  Procedure Laterality Date   ABDOMINAL HYSTERECTOMY     for endometriosis no h/o abnormal total ovaries uterus,cervix out    BREAST REDUCTION SURGERY     2001   CESAREAN SECTION     2000   CHOLECYSTECTOMY     EXTRACORPOREAL SHOCK WAVE LITHOTRIPSY Right 10/17/2021   Procedure: EXTRACORPOREAL SHOCK WAVE LITHOTRIPSY (ESWL);  Surgeon: Riki Altes, MD;  Location: [facility] ORS;  Service: Urology;   Laterality: Right;   REDUCTION MAMMAPLASTY Bilateral 2001    OB History   No obstetric history on file.      Home Medications    Prior to Admission medications   Medication Sig Start Date End Date Taking? Authorizing Provider  Ascorbic Acid (VITAMIN C) 1000 MG tablet Take 1,000 mg by mouth daily.    [provider]  cetirizine (ZYRTEC) 10 MG tablet Take 1 tablet (10 mg total) by mouth daily as needed for allergies. 02/14/22   McLean-Scocuzza, Pasty Spillers, MD  Cholecalciferol (VITAMIN D-3) 125 MCG (5000 UT) TABS Take by mouth.    [provider]  Cranberry-Vitamin C-Probiotic (AZO CRANBERRY PO) Take by mouth.    [provider]  ondansetron (ZOFRAN-ODT) 4 MG disintegrating tablet Take 1 tablet (4 mg total) by mouth every 8 (eight) hours as needed for nausea or vomiting. 02/04/23   Mickie Bail, NP    Family History Family History  Problem Relation Age of Onset   Heart disease Mother    Thyroid disease Mother        hypothyroidism   Heart disease Father    Hearing loss Father    Stroke Father    Kidney Stones Father    Birth defects Maternal Grandfather    Heart disease Maternal Grandfather    Stroke Maternal Grandfather  Birth defects Paternal Grandmother    Heart disease Paternal Grandfather    Kidney Stones Son    Breast cancer Neg Hx     Social History Social History   Tobacco Use   Smoking status: Never   Smokeless tobacco: Never  Substance Use Topics   Alcohol use: Not Currently   Drug use: Never     Allergies   Patient has no known allergies.   Review of Systems Review of Systems  Genitourinary:  Positive for dysuria.     Physical Exam Triage Vital Signs ED Triage Vitals [03/25/23 1227]  Enc Vitals Group     BP      Pulse      Resp      Temp      Temp src      SpO2      Weight      Height      Head Circumference      Peak Flow      Pain Score 0     Pain Loc      Pain Edu?      Excl. in GC?    No data  found.  Updated Vital Signs There were no vitals taken for this visit.  Visual Acuity Right Eye Distance:   Left Eye Distance:   Bilateral Distance:    Right Eye Near:   Left Eye Near:    Bilateral Near:     Physical Exam Vitals reviewed.  Constitutional:      Appearance: Normal appearance.  Skin:    General: Skin is warm and dry.  Neurological:     General: No focal deficit present.     Mental Status: She is alert and oriented to person, place, and time.  Psychiatric:        Mood and Affect: Mood normal.        Behavior: Behavior normal.      UC Treatments / Results  Labs (all labs ordered are listed, but only abnormal results are displayed) Labs Reviewed  POCT URINALYSIS DIP (MANUAL ENTRY)    EKG   Radiology No results found.  Procedures Procedures (including critical care time)  Medications Ordered in UC Medications - No data to display  Initial Impression / Assessment and Plan / UC Course  I have reviewed the triage vital signs and the nursing notes.  Pertinent labs & imaging results that were available during my care of the patient were reviewed by me and considered in my medical decision making (see chart for details).   Candace Joseph is a 56 y.o. female presenting with dysuria. Patient is afebrile without recent antipyretics, satting well on room air. Overall is well appearing though non-toxic, well hydrated, without respiratory distress.   Reviewed relevant chart history.   UA result is suggestive of urinary tract infection with small leukocytes and nitrites.  Treating patient with Keflex x 5 days and sending confirmatory culture and susceptibility to the lab.  Counseled patient on potential for adverse effects with medications prescribed/recommended today, ER and return-to-clinic precautions discussed, patient verbalized understanding and agreement with care plan.  Final Clinical Impressions(s) / UC Diagnoses   Final diagnoses:  None    Discharge Instructions   None    ED Prescriptions   None    PDMP not reviewed this encounter.   Charma Igo, [place] 03/25/23 1247

## 2023-03-27 ENCOUNTER — Encounter: Payer: Self-pay | Admitting: Family Medicine

## 2023-03-27 ENCOUNTER — Telehealth: Payer: Self-pay | Admitting: Family Medicine

## 2023-03-27 LAB — URINE CULTURE: Culture: 50000 — AB

## 2023-03-27 NOTE — Telephone Encounter (Signed)
Pt would like to be called concerning her lab work and want to know if she should postpone because she has a UTI

## 2023-03-30 ENCOUNTER — Other Ambulatory Visit: Payer: BC Managed Care – PPO

## 2023-03-31 NOTE — Telephone Encounter (Signed)
Called pt and she is going to do lab work on the day of her office visit.

## 2023-04-06 ENCOUNTER — Encounter: Payer: BC Managed Care – PPO | Admitting: Family Medicine

## 2023-04-08 ENCOUNTER — Other Ambulatory Visit (INDEPENDENT_AMBULATORY_CARE_PROVIDER_SITE_OTHER): Payer: BC Managed Care – PPO

## 2023-04-08 DIAGNOSIS — R739 Hyperglycemia, unspecified: Secondary | ICD-10-CM

## 2023-04-08 DIAGNOSIS — Z13220 Encounter for screening for lipoid disorders: Secondary | ICD-10-CM | POA: Diagnosis not present

## 2023-04-08 DIAGNOSIS — E559 Vitamin D deficiency, unspecified: Secondary | ICD-10-CM

## 2023-04-08 DIAGNOSIS — E063 Autoimmune thyroiditis: Secondary | ICD-10-CM

## 2023-04-08 LAB — TSH: TSH: 4.42 u[IU]/mL (ref 0.35–5.50)

## 2023-04-08 LAB — CBC WITH DIFFERENTIAL/PLATELET
Basophils Absolute: 0.1 10*3/uL (ref 0.0–0.1)
Basophils Relative: 0.9 % (ref 0.0–3.0)
Eosinophils Absolute: 0.4 10*3/uL (ref 0.0–0.7)
Eosinophils Relative: 6.9 % — ABNORMAL HIGH (ref 0.0–5.0)
HCT: 40.3 % (ref 36.0–46.0)
Hemoglobin: 13.3 g/dL (ref 12.0–15.0)
Lymphocytes Relative: 21.3 % (ref 12.0–46.0)
Lymphs Abs: 1.4 10*3/uL (ref 0.7–4.0)
MCHC: 33.1 g/dL (ref 30.0–36.0)
MCV: 90.4 fl (ref 78.0–100.0)
Monocytes Absolute: 0.5 10*3/uL (ref 0.1–1.0)
Monocytes Relative: 7.1 % (ref 3.0–12.0)
Neutro Abs: 4.1 10*3/uL (ref 1.4–7.7)
Neutrophils Relative %: 63.8 % (ref 43.0–77.0)
Platelets: 283 10*3/uL (ref 150.0–400.0)
RBC: 4.46 Mil/uL (ref 3.87–5.11)
RDW: 13.8 % (ref 11.5–15.5)
WBC: 6.5 10*3/uL (ref 4.0–10.5)

## 2023-04-08 LAB — LIPID PANEL
Cholesterol: 169 mg/dL (ref 0–200)
HDL: 59.1 mg/dL (ref >39.00)
LDL Cholesterol: 96 mg/dL (ref 0–99)
NonHDL: 109.91
Total CHOL/HDL Ratio: 3
Triglycerides: 71 mg/dL (ref 0.0–149.0)
VLDL: 14.2 mg/dL (ref 0.0–40.0)

## 2023-04-08 LAB — COMPREHENSIVE METABOLIC PANEL
ALT: 15 U/L (ref 0–35)
AST: 18 U/L (ref 0–37)
Albumin: 4.1 g/dL (ref 3.5–5.2)
Alkaline Phosphatase: 113 U/L (ref 39–117)
BUN: 14 mg/dL (ref 6–23)
CO2: 28 mEq/L (ref 19–32)
Calcium: 9.1 mg/dL (ref 8.4–10.5)
Chloride: 99 mEq/L (ref 96–112)
Creatinine, Ser: 0.81 mg/dL (ref 0.40–1.20)
GFR: 81.02 mL/min (ref >60.00)
Glucose, Bld: 94 mg/dL (ref 70–99)
Potassium: 4.4 mEq/L (ref 3.5–5.1)
Sodium: 136 mEq/L (ref 135–145)
Total Bilirubin: 0.8 mg/dL (ref 0.2–1.2)
Total Protein: 7.1 g/dL (ref 6.0–8.3)

## 2023-04-08 LAB — HEMOGLOBIN A1C: Hgb A1c MFr Bld: 5.2 % (ref 4.6–6.5)

## 2023-04-08 LAB — VITAMIN D 25 HYDROXY (VIT D DEFICIENCY, FRACTURES): VITD: 26.05 ng/mL — ABNORMAL LOW (ref 30.00–100.00)

## 2023-04-08 LAB — VITAMIN B12: Vitamin B-12: 339 pg/mL (ref 211–911)

## 2023-04-11 ENCOUNTER — Encounter: Payer: Self-pay | Admitting: Family Medicine

## 2023-04-12 ENCOUNTER — Other Ambulatory Visit: Payer: Self-pay | Admitting: Family Medicine

## 2023-04-12 DIAGNOSIS — E559 Vitamin D deficiency, unspecified: Secondary | ICD-10-CM

## 2023-04-12 MED ORDER — VITAMIN D (ERGOCALCIFEROL) 1.25 MG (50000 UNIT) PO CAPS: 50000.0000 [IU] | ORAL_CAPSULE | ORAL | 1 refills | Status: DC

## 2023-04-14 ENCOUNTER — Telehealth: Payer: BC Managed Care – PPO | Admitting: Family Medicine

## 2023-04-14 DIAGNOSIS — N390 Urinary tract infection, site not specified: Secondary | ICD-10-CM | POA: Diagnosis not present

## 2023-04-14 MED ORDER — SULFAMETHOXAZOLE-TRIMETHOPRIM 800-160 MG PO TABS: 1.0000 | ORAL_TABLET | Freq: Two times a day (BID) | ORAL | 0 refills | Status: AC

## 2023-04-14 NOTE — Progress Notes (Signed)
Virtual Visit Consent   Candace Joseph, you are scheduled for a virtual visit with a [facility] provider today. Just as with appointments in the office, your consent must be obtained to participate. Your consent will be active for this visit and any virtual visit you may have with one of our providers in the next 365 days. If you have a MyChart account, a copy of this consent can be sent to you electronically.  As this is a virtual visit, video technology does not allow for your provider to perform a traditional examination. This may limit your provider's ability to fully assess your condition. If your provider identifies any concerns that need to be evaluated in person or the need to arrange testing (such as labs, EKG, etc.), we will make arrangements to do so. Although advances in technology are sophisticated, we cannot ensure that it will always work on either your end or our end. If the connection with a video visit is poor, the visit may have to be switched to a telephone visit. With either a video or telephone visit, we are not always able to ensure that we have a secure connection.  By engaging in this virtual visit, you consent to the provision of healthcare and authorize for your insurance to be billed (if applicable) for the services provided during this visit. Depending on your insurance coverage, you may receive a charge related to this service.  I need to obtain your verbal consent now. Are you willing to proceed with your visit today? Candace Joseph has provided verbal consent on 04/14/2023 for a virtual visit (video or telephone). Freddy Finner, NP  Date: 04/14/2023 11:09 AM  Virtual Visit via Video Note   I, Freddy Finner, connected with  Candace Joseph  ([id number], 1966/09/05) on 04/14/23 at 11:15 AM EDT by a video-enabled telemedicine application and verified that I am speaking with the correct person using two identifiers.  Location: Patient: Virtual Visit Location Patient:  Home Provider: Virtual Visit Location Provider: Home Office   I discussed the limitations of evaluation and management by telemedicine and the availability of in person appointments. The patient expressed understanding and agreed to proceed.    History of Present Illness: Candace Joseph is a 57 y.o. who identifies as a female who was assigned female at birth, and is being seen today for UTI  Onset was restarted in last 24 hours- burning and frequency, and uncompleted emptying feeling  Associated symptoms are hesitancy,   Modifying factors are AZO cranberry  Denies chest pain, shortness of breath, fevers, chills, pelvic and back pain, n/v, blood in urine.  Of note was recently treated but with only 5 days worth of Keflex- C/S showed good sensitive to Macrobid, Keflex and Bactrim. Felt better but then came back on quickly.  Tearful as she did have an appt twice with PCP but they have had to reschedule it. This appt was for CPE. Was going to discuss UTI concerns at that time.     Problems:  Patient Active Problem List   Diagnosis Date Noted   Hyperglycemia 09/24/2022   Lipid screening 09/24/2022   Hashimoto's thyroiditis 05/19/2022   Nephrolithiasis 10/04/2021   Vitamin D deficiency 04/01/2021   Vitiligo 08/13/2018    Allergies: No Known Allergies Medications:  Current Outpatient Medications:    Ascorbic Acid (VITAMIN C) 1000 MG tablet, Take 1,000 mg by mouth daily., Disp: , Rfl:    ondansetron (ZOFRAN-ODT) 4 MG disintegrating tablet, Take 1  tablet (4 mg total) by mouth every 8 (eight) hours as needed for nausea or vomiting., Disp: 20 tablet, Rfl: 0   Vitamin D, Ergocalciferol, (DRISDOL) 1.25 MG (50000 UNIT) CAPS capsule, Take 1 capsule (50,000 Units total) by mouth every 7 (seven) days., Disp: 12 capsule, Rfl: 1  Observations/Objective: Patient is well-developed, well-nourished in no acute distress.  Resting comfortably  at home.  Head is normocephalic, atraumatic.  No labored  breathing.  Speech is clear and coherent with logical content.  Patient is alert and oriented at baseline.  Tearful  Assessment and Plan:  1. Recurrent UTI  - sulfamethoxazole-trimethoprim (BACTRIM DS) 800-160 MG tablet; Take 1 tablet by mouth 2 (two) times daily for 5 days.  Dispense: 10 tablet; Refill: 0  -take medication in full -take each dose with a full glass of water -increased to set up appt with PCP and get a urine dip to see if treatment this time worked- prior to starting any preventive medications -possible estrogen topically  -referral to Urology might be needed    Reviewed side effects, risks and benefits of medication.    Patient acknowledged agreement and understanding of the plan.   Past Medical, Surgical, Social History, Allergies, and Medications have been Reviewed.   Follow Up Instructions: I discussed the assessment and treatment plan with the patient. The patient was provided an opportunity to ask questions and all were answered. The patient agreed with the plan and demonstrated an understanding of the instructions.  A copy of instructions were sent to the patient via MyChart unless otherwise noted below.     The patient was advised to call back or seek an in-person evaluation if the symptoms worsen or if the condition fails to improve as anticipated.  Time:  I spent 13 minutes with the patient via telehealth technology discussing the above problems/concerns.    Freddy Finner, NP

## 2023-04-14 NOTE — Patient Instructions (Addendum)
  [name], thank you for joining Freddy Finner, NP for today's virtual visit.  While this provider is not your primary care provider (PCP), if your PCP is located in our provider database this encounter information will be shared with them immediately following your visit.   A [facility] MyChart account gives you access to today's visit and all your visits, tests, and labs performed at [facility] " click here if you don't have a [facility] MyChart account or go to [email or web address]  Consent: (Patient) Candace Joseph provided verbal consent for this virtual visit at the beginning of the encounter.  Current Medications:  Current Outpatient Medications:    sulfamethoxazole-trimethoprim (BACTRIM DS) 800-160 MG tablet, Take 1 tablet by mouth 2 (two) times daily for 5 days., Disp: 10 tablet, Rfl: 0   Ascorbic Acid (VITAMIN C) 1000 MG tablet, Take 1,000 mg by mouth daily., Disp: , Rfl:    ondansetron (ZOFRAN-ODT) 4 MG disintegrating tablet, Take 1 tablet (4 mg total) by mouth every 8 (eight) hours as needed for nausea or vomiting., Disp: 20 tablet, Rfl: 0   Vitamin D, Ergocalciferol, (DRISDOL) 1.25 MG (50000 UNIT) CAPS capsule, Take 1 capsule (50,000 Units total) by mouth every 7 (seven) days., Disp: 12 capsule, Rfl: 1   Medications ordered in this encounter:  Meds ordered this encounter  Medications   sulfamethoxazole-trimethoprim (BACTRIM DS) 800-160 MG tablet    Sig: Take 1 tablet by mouth 2 (two) times daily for 5 days.    Dispense:  10 tablet    Refill:  0    Order Specific Question:   Supervising Provider    Answer:   Merrilee Jansky [id number]     *If you need refills on other medications prior to your next appointment, please contact your pharmacy*  Follow-Up: Call back or seek an in-person evaluation if the symptoms worsen or if the condition fails to improve as anticipated.  [facility] [phone]  Other Instructions  take  medication in full -take each dose with a full glass of water -increased to set up appt with PCP and get a urine dip to see if treatment this time worked- prior to starting any preventive medications -possible estrogen topically  -referral to Urology might be needed    If you have been instructed to have an in-person evaluation today at a local Urgent Care facility, please use the link below. It will take you to a list of all of our available [facility], including address, phone number and hours of operation. Please do not delay care.  [facility]  If you or a family member do not have a primary care provider, use the link below to schedule a visit and establish care. When you choose a [facility] primary care physician or advanced practice provider, you gain a long-term partner in health. Find a Primary Care Provider  Learn more about [facility]'s in-office and virtual care options: [facility] - Get Care Now

## 2023-04-15 ENCOUNTER — Encounter: Payer: BC Managed Care – PPO | Admitting: Family Medicine

## 2023-04-23 ENCOUNTER — Encounter: Payer: Self-pay | Admitting: Nurse Practitioner

## 2023-04-23 ENCOUNTER — Ambulatory Visit: Payer: BC Managed Care – PPO | Admitting: Nurse Practitioner

## 2023-04-23 VITALS — BP 122/70 | HR 63 | Temp 97.9°F | Ht 65.0 in | Wt 196.0 lb

## 2023-04-23 DIAGNOSIS — N390 Urinary tract infection, site not specified: Secondary | ICD-10-CM

## 2023-04-23 LAB — POC URINALSYSI DIPSTICK (AUTOMATED)
Bilirubin, UA: NEGATIVE
Blood, UA: NEGATIVE
Glucose, UA: NEGATIVE
Ketones, UA: NEGATIVE
Leukocytes, UA: NEGATIVE
Nitrite, UA: NEGATIVE
Protein, UA: NEGATIVE
Spec Grav, UA: 1.015 (ref 1.010–1.025)
Urobilinogen, UA: 0.2 E.U./dL
pH, UA: 7 (ref 5.0–8.0)

## 2023-04-23 NOTE — Progress Notes (Signed)
Established Patient Office Visit  Subjective:  Patient ID: Candace Joseph, female    DOB: 1966/07/30  Age: 57 y.o. MRN: [id number]  CC:  Chief Complaint  Patient presents with   Urinary Tract Infection    HPI  [name] presents for recurrent UTI.  Urinary Tract Infection  This is a recurrent problem. The problem occurs every urination. The quality of the pain is described as burning. The pain is mild. There has been no fever. She is Sexually active. Associated symptoms include frequency and urgency. Pertinent negatives include no hematuria. Associated symptoms comments: Strong odor. She has tried increased fluids and antibiotics for the symptoms. The treatment provided mild relief. Her past medical history is significant for recurrent UTIs.  She has no symptoms today.  Past Medical History:  Diagnosis Date   COVID-19    10/23/20   Flu    2022   PONV (postoperative nausea and vomiting)    Right kidney stone    Sinusitis    07/29/21 sinusitis + flu seen tx'ed urgent care tamiflu and augmentin   Skin lesion 08/13/2018   Vitiligo    arms, legs FH vitiligo   Well adult exam 03/27/2020    Past Surgical History:  Procedure Laterality Date   ABDOMINAL HYSTERECTOMY     for endometriosis no h/o abnormal total ovaries uterus,cervix out    BREAST REDUCTION SURGERY     2001   CESAREAN SECTION     2000   CHOLECYSTECTOMY     EXTRACORPOREAL SHOCK WAVE LITHOTRIPSY Right 10/17/2021   Procedure: EXTRACORPOREAL SHOCK WAVE LITHOTRIPSY (ESWL);  Surgeon: Riki Altes, MD;  Location: [facility] ORS;  Service: Urology;  Laterality: Right;   REDUCTION MAMMAPLASTY Bilateral 2001    Family History  Problem Relation Age of Onset   Heart disease Mother    Thyroid disease Mother        hypothyroidism   Heart disease Father    Hearing loss Father    Stroke Father    Kidney Stones Father    Birth defects Maternal Grandfather    Heart disease Maternal Grandfather    Stroke Maternal  Grandfather    Birth defects Paternal Grandmother    Heart disease Paternal Grandfather    Kidney Stones Son    Breast cancer Neg Hx     Social History   Socioeconomic History   Marital status: Married    Spouse name: Not on file   Number of children: Not on file   Years of education: Not on file   Highest education level: Not on file  Occupational History   Not on file  Tobacco Use   Smoking status: Never   Smokeless tobacco: Never  Substance and Sexual Activity   Alcohol use: Not Currently   Drug use: Never   Sexual activity: Yes    Birth control/protection: None  Other Topics Concern   Not on file  Social History Narrative   2 kids son and daughter    Midwife    Married    Owns guns, wears seat belt, safe in relationship   Social Determinants of Health   Financial Resource Strain: Not on file  Food Insecurity: Not on file  Transportation Needs: Not on file  Physical Activity: Not on file  Stress: Not on file  Social Connections: Not on file  Intimate Partner Violence: Not on file     Outpatient Medications Prior to Visit  Medication Sig Dispense Refill   Ascorbic Acid (  VITAMIN C) 1000 MG tablet Take 1,000 mg by mouth daily.     ondansetron (ZOFRAN-ODT) 4 MG disintegrating tablet Take 1 tablet (4 mg total) by mouth every 8 (eight) hours as needed for nausea or vomiting. 20 tablet 0   Vitamin D, Ergocalciferol, (DRISDOL) 1.25 MG (50000 UNIT) CAPS capsule Take 1 capsule (50,000 Units total) by mouth every 7 (seven) days. 12 capsule 1   No facility-administered medications prior to visit.    No Known Allergies  ROS Review of Systems  Genitourinary:  Positive for frequency and urgency. Negative for hematuria.   Negative unless indicated in HPI.    Objective:    Physical Exam  BP 122/70   Pulse 63   Temp 97.9 F (36.6 C) (Oral)   Ht 5\' 5"  (1.651 m)   Wt 196 lb (88.9 kg)   SpO2 99%   BMI 32.62 kg/m  Wt Readings from Last 3  Encounters:  04/29/23 196 lb (88.9 kg)  04/23/23 196 lb (88.9 kg)  12/08/22 200 lb (90.7 kg)     Health Maintenance  Topic Date Due   HIV Screening  Never done   COVID-19 Vaccine (3 - 2023-24 season) 05/30/2022   INFLUENZA VACCINE  04/30/2023   MAMMOGRAM  11/26/2024   Colonoscopy  11/07/2026   DTaP/Tdap/Td (2 - Td or Tdap) 03/29/2031   Hepatitis C Screening  Completed   Zoster Vaccines- Shingrix  Completed   HPV VACCINES  Aged Out   PAP SMEAR-Modifier  Discontinued    There are no preventive care reminders to display for this patient.  Lab Results  Component Value Date   TSH 4.42 04/08/2023   Lab Results  Component Value Date   WBC 6.5 04/08/2023   HGB 13.3 04/08/2023   HCT 40.3 04/08/2023   MCV 90.4 04/08/2023   PLT 283.0 04/08/2023   Lab Results  Component Value Date   NA 136 04/08/2023   K 4.4 04/08/2023   CO2 28 04/08/2023   GLUCOSE 94 04/08/2023   BUN 14 04/08/2023   CREATININE 0.81 04/08/2023   BILITOT 0.8 04/08/2023   ALKPHOS 113 04/08/2023   AST 18 04/08/2023   ALT 15 04/08/2023   PROT 7.1 04/08/2023   ALBUMIN 4.1 04/08/2023   CALCIUM 9.1 04/08/2023   ANIONGAP 5 05/31/2022   GFR 81.02 04/08/2023   Lab Results  Component Value Date   CHOL 169 04/08/2023   Lab Results  Component Value Date   HDL 59.10 04/08/2023   Lab Results  Component Value Date   LDLCALC 96 04/08/2023   Lab Results  Component Value Date   TRIG 71.0 04/08/2023   Lab Results  Component Value Date   CHOLHDL 3 04/08/2023   Lab Results  Component Value Date   HGBA1C 5.2 04/08/2023      Assessment & Plan:  Recurrent UTI Assessment & Plan: POCT urinalysis negative. Advised patient to clean from front to back and use cotton underpants. Advised to increase fluid intake. Advised patient to follow-up with urology.  Orders: -     POCT Urinalysis Dipstick (Automated)    Follow-up: Return if symptoms worsen or fail to improve.   Kara Dies, NP

## 2023-04-28 NOTE — Progress Notes (Unsigned)
04/29/23 10:00 AM   Candace Joseph 1966-04-07 [id number]  Referring provider:  Dana Allan, MD [address] [place],  [place] [address]  Urological history  Nephrolithiasis  - A CT was ordered on 10/03/2021 by her PCP for her persistent back pain, and showed a 5 mm nonobstructing right mid pole renal stone - Underwent ESWL on 10/17/2021 for right renal stone with Dr. Lonna Cobb.  - KUB on 11/11/2021 visualized renal stone no longer visualized. Lower abdominal and pelvic calcifications consistent phleboliths  -CT renal stone study (05/2022) - 4 mm stone identified within the upper pole collecting system of the right kidney -KUB (11/2022) - no stones seen, but image largest obscured by overlying gas and stool   Chief Complaint  Patient presents with   Over Active Bladder      HPI: Candace Joseph is a 57 y.o.female who presents today for recurrent UTI's.  Previous records reviewed.    Over the last year, she had a positive urine culture for E. coli on March 25, 2023, she had less than 10,000 colonies on Feb 04, 2023, she had no growth on December 08, 2022, she had positive urine culture for E. coli on November 05, 2022, she had a positive urine culture for E. coli in September 01, 2022 and she had mixed urogenital flora on May 15, 2022.    Her risk factors for recurrent UTIs are age, GSM, constipation, low Vitamin D levels, sexually active and hyperglycemia.    Her symptoms when she has UTI's are urgency, dysuria and frequency urinating small amounts.   She is not having symptoms today.   She is having 8 or more trips to the bathroom, nocturia x 1-2 and severe urge to urge to urinate.  She does engage in toilet mapping.  She does have some issues with possible leakage.  She will wear pads if she feels she will be on a long trip or an environment where she is not sure of the toilet situation.  These pads are sometimes damp, but she is not certain whether it is urine or just sweat.  Patient  denies any modifying or aggravating factors.  Patient denies any  gross hematuria, dysuria or suprapubic/flank pain.  Patient denies any fevers, chills, nausea or vomiting.   PVR 0 mL   PMH: Past Medical History:  Diagnosis Date   COVID-19    10/23/20   Flu    2022   PONV (postoperative nausea and vomiting)    Right kidney stone    Sinusitis    07/29/21 sinusitis + flu seen tx'ed urgent care tamiflu and augmentin   Skin lesion 08/13/2018   Vitiligo    arms, legs FH vitiligo   Well adult exam 03/27/2020    Surgical History: Past Surgical History:  Procedure Laterality Date   ABDOMINAL HYSTERECTOMY     for endometriosis no h/o abnormal total ovaries uterus,cervix out    BREAST REDUCTION SURGERY     2001   CESAREAN SECTION     2000   CHOLECYSTECTOMY     EXTRACORPOREAL SHOCK WAVE LITHOTRIPSY Right 10/17/2021   Procedure: EXTRACORPOREAL SHOCK WAVE LITHOTRIPSY (ESWL);  Surgeon: Riki Altes, MD;  Location: [facility] ORS;  Service: Urology;  Laterality: Right;   REDUCTION MAMMAPLASTY Bilateral 2001    Home Medications:  Allergies as of 04/29/2023   No Known Allergies      Medication List        Accurate as of April 29, 2023 10:00 AM. If you  have any questions, ask your nurse or doctor.          estradiol 0.1 MG/GM vaginal cream Commonly known as: ESTRACE VAGINAL Apply 0.5mg  (pea-sized amount)  just inside the vaginal introitus with a finger-tip on Monday, Wednesday and Friday nights. Started by: Carollee Herter Danaka Llera   ondansetron 4 MG disintegrating tablet Commonly known as: ZOFRAN-ODT Take 1 tablet (4 mg total) by mouth every 8 (eight) hours as needed for nausea or vomiting.   vitamin C 1000 MG tablet Take 1,000 mg by mouth daily.   Vitamin D (Ergocalciferol) 1.25 MG (50000 UNIT) Caps capsule Commonly known as: DRISDOL Take 1 capsule (50,000 Units total) by mouth every 7 (seven) days.        Allergies:  No Known Allergies  Family History: Family History   Problem Relation Age of Onset   Heart disease Mother    Thyroid disease Mother        hypothyroidism   Heart disease Father    Hearing loss Father    Stroke Father    Kidney Stones Father    Birth defects Maternal Grandfather    Heart disease Maternal Grandfather    Stroke Maternal Grandfather    Birth defects Paternal Grandmother    Heart disease Paternal Grandfather    Kidney Stones Son    Breast cancer Neg Hx     Social History:  reports that she has never smoked. She has never used smokeless tobacco. She reports that she does not currently use alcohol. She reports that she does not use drugs.   Physical Exam: BP 121/77   Pulse 77   Wt 196 lb (88.9 kg)   BMI 32.62 kg/m   Constitutional:  Well nourished. Alert and oriented, No acute distress. HEENT: [place] AT, moist mucus membranes.  Trachea midline Cardiovascular: No clubbing, cyanosis, or edema. Respiratory: Normal respiratory effort, no increased work of breathing. Neurologic: Grossly intact, no focal deficits, moving all 4 extremities. Psychiatric: Normal mood and affect.    Laboratory Data: CMP     Component Value Date/Time   NA 136 04/08/2023 0832   K 4.4 04/08/2023 0832   CL 99 04/08/2023 0832   CO2 28 04/08/2023 0832   GLUCOSE 94 04/08/2023 0832   BUN 14 04/08/2023 0832   CREATININE 0.81 04/08/2023 0832   CREATININE 0.88 03/28/2020 0952   CALCIUM 9.1 04/08/2023 0832   PROT 7.1 04/08/2023 0832   ALBUMIN 4.1 04/08/2023 0832   AST 18 04/08/2023 0832   ALT 15 04/08/2023 0832   ALKPHOS 113 04/08/2023 0832   BILITOT 0.8 04/08/2023 0832   GFR 81.02 04/08/2023 0832   GFRNONAA >60 05/31/2022 0544    Component     Latest Ref Rng 04/08/2023  Hemoglobin A1C     4.6 - 6.5 % 5.2    Component     Latest Ref Rng 04/08/2023  VITD     30.00 - 100.00 ng/mL 26.05 (L)     Legend: (L) Low I have reviewed the labs.   Pertinent Imaging: Results for orders placed or performed in visit on 04/29/23  Bladder Scan  (Post Void Residual) in office  Result Value Ref Range   Scan Result 0ml      Assessment & Plan:    1. rUTI's - criteria for recurrent UTI has been met with 2 or more infections in 6 months or 3 or greater infections in one year  - patient is increasing her water intake until the urine is pale yellow or  clear (10 to 12 cups daily)  - patient is taking Vitamin C 1,000 mg daily to acidify the urine  -Patient has recently been started on vitamin D - patient is wipe front to back after urinating  - advised them to have CATH UA's for urinalysis and culture to prevent skin, vaginal and/or rectal contamination of the specimen                                 2. Nephrolithiasis -Asymptomatic at today's visit  3. GSM -I explained to the patient that when women go through menopause and her estrogen levels are severely diminished, the normal vaginal flora will change.  This is due to an increase of the vaginal canal's pH. Because of this, the vaginal canal may be colonized by bacteria from the rectum instead of the protective lactobacillus.  This, accompanied by the loss of the mucus barrier with vaginal atrophy, is a cause of recurrent urinary tract infections. -In some studies, the use of vaginal estrogen cream has been demonstrated to reduce  recurrent urinary tract infections to one a year.  -Patient was given prescription of vaginal estrogen cream  and instructed to apply 0.5mg  (pea-sized amount)  just inside the vaginal introitus with a finger-tip on Monday, Wednesday and Friday nights.  I explained to the patient that vaginally administered estrogen, which causes only a slight increase in the blood estrogen levels, have fewer contraindications and adverse systemic effects that oral HT. -She will follow up in three months   Return in about 3 months (around 07/30/2023) for oab .   Cloretta Ned   [facility] [address], [address] [place], [place]  [address] [phone]

## 2023-04-29 ENCOUNTER — Ambulatory Visit: Payer: BC Managed Care – PPO | Admitting: Urology

## 2023-04-29 ENCOUNTER — Encounter: Payer: Self-pay | Admitting: Urology

## 2023-04-29 VITALS — BP 121/77 | HR 77 | Wt 196.0 lb

## 2023-04-29 DIAGNOSIS — N952 Postmenopausal atrophic vaginitis: Secondary | ICD-10-CM | POA: Diagnosis not present

## 2023-04-29 DIAGNOSIS — Z87442 Personal history of urinary calculi: Secondary | ICD-10-CM | POA: Diagnosis not present

## 2023-04-29 DIAGNOSIS — N200 Calculus of kidney: Secondary | ICD-10-CM

## 2023-04-29 DIAGNOSIS — Z87440 Personal history of urinary (tract) infections: Secondary | ICD-10-CM

## 2023-04-29 DIAGNOSIS — N390 Urinary tract infection, site not specified: Secondary | ICD-10-CM

## 2023-04-29 LAB — BLADDER SCAN AMB NON-IMAGING

## 2023-04-29 MED ORDER — ESTRADIOL 0.1 MG/GM VA CREA: TOPICAL_CREAM | VAGINAL | 12 refills | Status: DC

## 2023-05-04 DIAGNOSIS — N390 Urinary tract infection, site not specified: Secondary | ICD-10-CM | POA: Insufficient documentation

## 2023-05-04 NOTE — Assessment & Plan Note (Signed)
POCT urinalysis negative. Advised patient to clean from front to back and use cotton underpants. Advised to increase fluid intake. Advised patient to follow-up with urology.

## 2023-07-25 NOTE — Progress Notes (Unsigned)
07/29/23 4:15 PM   Candace Joseph 1966-07-27 [id number]  Referring provider:  Dana Allan, MD [address] [place],  [place] [address]  Urological history  Nephrolithiasis  - A CT was ordered on 10/03/2021 by her PCP for her persistent back pain, and showed a 5 mm nonobstructing right mid pole renal stone - Underwent ESWL on 10/17/2021 for right renal stone with Dr. Lonna Cobb.  - KUB on 11/11/2021 visualized renal stone no longer visualized.  Lower abdominal and pelvic calcifications consistent phleboliths  -CT renal stone study (05/2022) - 4 mm stone identified within the upper pole collecting system of the right kidney -KUB (11/2022) - no stones seen, but image largest obscured by overlying gas and stool   Chief Complaint  Patient presents with   Follow-up      HPI: Candace Joseph is a 57 y.o.female who presents today for three months follow up.   Previous records reviewed.    At her visit in July, over the last year, she had a positive urine culture for E. coli on March 25, 2023, she had less than 10,000 colonies on Feb 04, 2023, she had no growth on December 08, 2022, she had positive urine culture for E. coli on November 05, 2022, she had a positive urine culture for E. coli in September 01, 2022 and she had mixed urogenital flora on May 15, 2022.   Her risk factors for recurrent UTIs are age, GSM, constipation, low Vitamin D levels, sexually active and hyperglycemia.   Her symptoms when she has UTI's are urgency, dysuria and frequency urinating small amounts.   She is not having symptoms today.   She is having 8 or more trips to the bathroom, nocturia x 1-2 and severe urge to urge to urinate.  She does engage in toilet mapping.  She does have some issues with possible leakage.  She will wear pads if she feels she will be on a long trip or an environment where she is not sure of the toilet situation.  These pads are sometimes damp, but she is not certain whether it is urine or just sweat.   Patient denies any modifying or aggravating factors.  Patient denies any  gross hematuria, dysuria or suprapubic/flank pain.  Patient denies any fevers, chills, nausea or vomiting.   PVR 0 mL.  She was given a script for vaginal estrogen cream.   She is having 1-7 daytime voids, nocturia x 1-2 with a mild urge to urinate.  She does have urinary leakage.  She wears 3 absorbent pads daily.  She does not limit fluid intake.  She does engage in toilet mapping..  Patient denies any modifying or aggravating factors.  Patient denies any recent UTI's, gross hematuria, dysuria or suprapubic/flank pain.  Patient denies any fevers, chills, nausea or vomiting.    She been applying the vaginal estrogen cream as directed.  She states that she no longer has vaginal discomfort.  Sexual intercourse is also not as painful.  PMH: Past Medical History:  Diagnosis Date   COVID-19    10/23/20   Flu    2022   PONV (postoperative nausea and vomiting)    Right kidney stone    Sinusitis    07/29/21 sinusitis + flu seen tx'ed urgent care tamiflu and augmentin   Skin lesion 08/13/2018   Vitiligo    arms, legs FH vitiligo   Well adult exam 03/27/2020    Surgical History: Past Surgical History:  Procedure Laterality Date  ABDOMINAL HYSTERECTOMY     for endometriosis no h/o abnormal total ovaries uterus,cervix out    BREAST REDUCTION SURGERY     2001   CESAREAN SECTION     2000   CHOLECYSTECTOMY     EXTRACORPOREAL SHOCK WAVE LITHOTRIPSY Right 10/17/2021   Procedure: EXTRACORPOREAL SHOCK WAVE LITHOTRIPSY (ESWL);  Surgeon: Riki Altes, MD;  Location: [facility] ORS;  Service: Urology;  Laterality: Right;   REDUCTION MAMMAPLASTY Bilateral 2001    Home Medications:  Allergies as of 07/29/2023   No Known Allergies      Medication List        Accurate as of July 29, 2023  4:15 PM. If you have any questions, ask your nurse or doctor.          Cholecalciferol 125 MCG (5000 UT) Tabs Take by  mouth.   Cranberry [facility] 215 MG Caps Generic drug: Cranberry Take by mouth.   estradiol 0.1 MG/GM vaginal cream Commonly known as: ESTRACE VAGINAL Apply 0.5mg  (pea-sized amount)  just inside the vaginal introitus with a finger-tip on Monday, Wednesday and Friday nights.   ondansetron 4 MG disintegrating tablet Commonly known as: ZOFRAN-ODT Take 1 tablet (4 mg total) by mouth every 8 (eight) hours as needed for nausea or vomiting.   vitamin C 1000 MG tablet Take 1,000 mg by mouth daily.   Vitamin D (Ergocalciferol) 1.25 MG (50000 UNIT) Caps capsule Commonly known as: DRISDOL Take 1 capsule (50,000 Units total) by mouth every 7 (seven) days.        Allergies:  No Known Allergies  Family History: Family History  Problem Relation Age of Onset   Heart disease Mother    Thyroid disease Mother        hypothyroidism   Heart disease Father    Hearing loss Father    Stroke Father    Kidney Stones Father    Birth defects Maternal Grandfather    Heart disease Maternal Grandfather    Stroke Maternal Grandfather    Birth defects Paternal Grandmother    Heart disease Paternal Grandfather    Kidney Stones Son    Breast cancer Neg Hx     Social History:  reports that she has never smoked. She has never used smokeless tobacco. She reports that she does not currently use alcohol. She reports that she does not use drugs.   Physical Exam: BP 127/78   Pulse 68   Constitutional:  Well nourished. Alert and oriented, No acute distress. HEENT: [place] AT, moist mucus membranes.  Trachea midline Cardiovascular: No clubbing, cyanosis, or edema. Respiratory: Normal respiratory effort, no increased work of breathing. Neurologic: Grossly intact, no focal deficits, moving all 4 extremities. Psychiatric: Normal mood and affect.    Laboratory Data: N/A  Pertinent Imaging: N/A   Assessment & Plan:    1. rUTI's -Asymptomatic at this visit -Continue the vaginal estrogen cream,  vitamin D and vitamin C                                 2. Nephrolithiasis -Asymptomatic at today's visit -return next year for KUB  3. GSM -continue vaginal estrogen cream   Return in about 1 year (around 07/28/2024) for KUB, OAB, PVR .   Cloretta Ned   [facility] [address] [place], [place] [address] [phone]

## 2023-07-29 ENCOUNTER — Encounter: Payer: Self-pay | Admitting: Urology

## 2023-07-29 ENCOUNTER — Ambulatory Visit: Payer: BC Managed Care – PPO | Admitting: Urology

## 2023-07-29 VITALS — BP 127/78 | HR 68

## 2023-07-29 DIAGNOSIS — Z87442 Personal history of urinary calculi: Secondary | ICD-10-CM | POA: Diagnosis not present

## 2023-07-29 DIAGNOSIS — N390 Urinary tract infection, site not specified: Secondary | ICD-10-CM

## 2023-07-29 DIAGNOSIS — N952 Postmenopausal atrophic vaginitis: Secondary | ICD-10-CM

## 2023-07-29 DIAGNOSIS — Z87440 Personal history of urinary (tract) infections: Secondary | ICD-10-CM

## 2023-07-29 DIAGNOSIS — N200 Calculus of kidney: Secondary | ICD-10-CM

## 2023-08-10 ENCOUNTER — Ambulatory Visit: Payer: BC Managed Care – PPO | Admitting: Family Medicine

## 2023-08-10 ENCOUNTER — Encounter: Payer: Self-pay | Admitting: Family Medicine

## 2023-08-10 VITALS — BP 118/68 | HR 65 | Temp 98.0°F | Resp 16 | Ht 65.0 in | Wt 196.2 lb

## 2023-08-10 DIAGNOSIS — Z0000 Encounter for general adult medical examination without abnormal findings: Secondary | ICD-10-CM

## 2023-08-10 DIAGNOSIS — R413 Other amnesia: Secondary | ICD-10-CM | POA: Diagnosis not present

## 2023-08-10 DIAGNOSIS — M25549 Pain in joints of unspecified hand: Secondary | ICD-10-CM | POA: Insufficient documentation

## 2023-08-10 DIAGNOSIS — M792 Neuralgia and neuritis, unspecified: Secondary | ICD-10-CM | POA: Insufficient documentation

## 2023-08-10 DIAGNOSIS — N200 Calculus of kidney: Secondary | ICD-10-CM | POA: Diagnosis not present

## 2023-08-10 DIAGNOSIS — N390 Urinary tract infection, site not specified: Secondary | ICD-10-CM | POA: Diagnosis not present

## 2023-08-10 DIAGNOSIS — G5630 Lesion of radial nerve, unspecified upper limb: Secondary | ICD-10-CM | POA: Insufficient documentation

## 2023-08-10 MED ORDER — ONDANSETRON 4 MG PO TBDP: 4.0000 mg | ORAL_TABLET | Freq: Three times a day (TID) | ORAL | 0 refills | Status: DC | PRN

## 2023-08-10 NOTE — Patient Instructions (Signed)
It was a pleasure meeting you today. Thank you for allowing me to take part in your health care.  Our goals for today as we discussed include:  Refill sent for requested medication  Follow up as needed   This is a list of the screening recommended for you and due dates:  Health Maintenance  Topic Date Due   HIV Screening  Never done   Pap with HPV screening  Never done   COVID-19 Vaccine (3 - 2023-24 season) 05/31/2023   Flu Shot  12/28/2023*   Mammogram  11/26/2024   Colon Cancer Screening  11/07/2026   DTaP/Tdap/Td vaccine (2 - Td or Tdap) 03/29/2031   Hepatitis C Screening  Completed   Zoster (Shingles) Vaccine  Completed   HPV Vaccine  Aged Out  *Topic was postponed. The date shown is not the original due date.      If you have any questions or concerns, please do not hesitate to call the office at [phone].  I look forward to our next visit and until then take care and stay safe.  Regards,   Dana Allan, MD   [facility]

## 2023-08-10 NOTE — Progress Notes (Unsigned)
SUBJECTIVE:   Chief Complaint  Patient presents with  . Annual Exam   HPI Presents to clinic for annual physical  Discussed the use of AI scribe software for clinical note transcription with the patient, who gave verbal consent to proceed.  History of Present Illness The patient, with a history of diabetes, thyroid disease, and recurrent urinary tract infections (UTIs), presents for a routine physical. They report that their diabetes and thyroid disease are well-controlled, with recent lab work showing satisfactory results. They have been taking prescribed Vitamin D due to a slight deficiency.  The patient has been experiencing recurrent UTIs and has recently seen a urologist. They were prescribed a cream, which has been effective in managing their symptoms. They also believe that the Vitamin D has contributed to their improvement.  The patient also reports a recent issue with memory recall. Over the past couple of months, they have noticed difficulty in retrieving certain memories, which was particularly noticeable during a recent conversation with their daughter. They deny any other cognitive symptoms such as slurred speech, muscle weakness, numbness, tingling, or disorientation. They are able to perform daily activities independently, including work, and have not noticed any issues in these areas.  The patient also mentions a history of kidney stones, which are currently being managed conservatively. They occasionally experience nausea, which they believe may be related to the kidney stones. They request a refill of their as-needed nausea medication.  Lastly, the patient has been seeing a chiropractor for hip pain, which has been effective. They have not noticed any correlation between the chiropractic manipulations and their memory issues.    PERTINENT PMH / PSH: As above  OBJECTIVE:  BP 118/68   Pulse 65   Temp 98 F (36.7 C)   Resp 16   Ht 5\' 5"  (1.651 m)   Wt 196 lb 4 oz  (89 kg)   SpO2 95%   BMI 32.66 kg/m    Physical Exam Vitals reviewed.  Constitutional:      General: She is not in acute distress.    Appearance: She is obese. She is not ill-appearing.  HENT:     Head: Normocephalic.     Right Ear: Tympanic membrane, ear canal and external ear normal.     Left Ear: Tympanic membrane, ear canal and external ear normal.     Nose: Nose normal.     Mouth/Throat:     Mouth: Mucous membranes are moist.  Eyes:     Extraocular Movements: Extraocular movements intact.     Conjunctiva/sclera: Conjunctivae normal.     Pupils: Pupils are equal, round, and reactive to light.  Neck:     Thyroid: No thyromegaly or thyroid tenderness.     Vascular: No carotid bruit.  Cardiovascular:     Rate and Rhythm: Normal rate and regular rhythm.     Pulses: Normal pulses.     Heart sounds: Normal heart sounds.  Pulmonary:     Effort: Pulmonary effort is normal.     Breath sounds: Normal breath sounds.  Abdominal:     General: Bowel sounds are normal. There is no distension.     Palpations: Abdomen is soft.     Tenderness: There is no abdominal tenderness. There is no right CVA tenderness, left CVA tenderness, guarding or rebound.  Musculoskeletal:        General: Normal range of motion.     Cervical back: Normal range of motion.     Right lower leg: No edema.  Left lower leg: No edema.  Lymphadenopathy:     Cervical: No cervical adenopathy.  Skin:    Capillary Refill: Capillary refill takes less than 2 seconds.  Neurological:     General: No focal deficit present.     Mental Status: She is alert and oriented to person, place, and time. Mental status is at baseline.     Motor: No weakness.  Psychiatric:        Mood and Affect: Mood normal.        Behavior: Behavior normal.        Thought Content: Thought content normal.        Judgment: Judgment normal.       08/10/2023   11:30 AM 04/23/2023   11:41 AM 09/24/2022    8:13 AM 04/02/2022    9:04 AM  02/14/2022    3:18 PM  Depression screen PHQ 2/9  Decreased Interest 0 0 0 0 0  Down, Depressed, Hopeless 0 0 0 0 0  PHQ - 2 Score 0 0 0 0 0  Altered sleeping 0  1    Tired, decreased energy 0  0    Change in appetite 0  0    Feeling bad or failure about yourself  0  0    Trouble concentrating 0  0    Moving slowly or fidgety/restless 0  0    Suicidal thoughts 0  0    PHQ-9 Score 0  1    Difficult doing work/chores Not difficult at all  Not difficult at all        08/10/2023   11:30 AM 09/24/2022    8:13 AM 06/27/2020    3:00 PM  GAD 7 : Generalized Anxiety Score  Nervous, Anxious, on Edge 0 1 0  Control/stop worrying 0 1 0  Worry too much - different things 0 0 0  Trouble relaxing 0 1 0  Restless 0 0 0  Easily annoyed or irritable 0 0 0  Afraid - awful might happen 0 0 0  Total GAD 7 Score 0 3 0  Anxiety Difficulty Not difficult at all Not difficult at all     ASSESSMENT/PLAN:  Annual physical exam Assessment & Plan: Up to date on mammogram and colonoscopy. Declined flu vaccine. Considering pneumonia vaccine. -Consider pneumonia vaccine (Pneumovax 20 recommended). -Notify office if decision is made to receive pneumonia vaccine.    Nephrolithiasis Assessment & Plan: Occasional nausea possibly related to kidney stone. Plan to allow stone to pass naturally unless it grows or becomes symptomatic. -Continue monitoring kidney stone. -Refill nausea medication as needed.   Recurrent UTI Assessment & Plan: Improvement with topical estrogen cream. Discussed the importance of checking for UTI if symptoms recur, especially with fevers, abdominal pain, or hematuria. -Continue topical estrogen cream as prescribed by urologist. -Check for UTI if symptoms recur.   Memory change Assessment & Plan: Recent episodes of difficulty recalling information. No other neurological symptoms. Family history of late-onset Alzheimer's. Discussed potential causes including stress, sleep,  nutrition, hydration, and possible effects of menopause. -Monitor memory issues over the next month. -If memory issues persist or worsen, return for further evaluation including full blood panel and memory check.   Other orders -     Ondansetron; Take 1 tablet (4 mg total) by mouth every 8 (eight) hours as needed for nausea or vomiting.  Dispense: 20 tablet; Refill: 0           PDMP reviewed  Return if symptoms worsen  or fail to improve, for PCP.  Dana Allan, MD

## 2023-08-12 DIAGNOSIS — R413 Other amnesia: Secondary | ICD-10-CM | POA: Insufficient documentation

## 2023-08-12 DIAGNOSIS — Z0000 Encounter for general adult medical examination without abnormal findings: Secondary | ICD-10-CM | POA: Insufficient documentation

## 2023-08-12 NOTE — Assessment & Plan Note (Signed)
Up to date on mammogram and colonoscopy. Declined flu vaccine. -Consider pneumonia vaccine (Pneumovax 20 recommended). -Notify office if decision is made to receive pneumonia vaccine.  -Declined HIV screening -Reviewed recent annual blood work

## 2023-08-12 NOTE — Assessment & Plan Note (Signed)
Occasional nausea possibly related to kidney stone. Plan to allow stone to pass naturally unless it grows or becomes symptomatic. -Continue monitoring kidney stone. -Refill nausea medication as needed.

## 2023-08-12 NOTE — Assessment & Plan Note (Signed)
Recent episodes of difficulty recalling information. No other neurological symptoms. Family history of late-onset Alzheimer's. Discussed potential causes including stress, sleep, nutrition, hydration, and possible effects of menopause. -Monitor memory issues over the next month. -If memory issues persist or worsen, return for further evaluation including full blood panel and memory check.

## 2023-08-12 NOTE — Assessment & Plan Note (Signed)
Improvement with topical estrogen cream. Discussed the importance of checking for UTI if symptoms recur, especially with fevers, abdominal pain, or hematuria. -Continue topical estrogen cream as prescribed by urologist. -Check for UTI if symptoms recur.

## 2023-08-13 ENCOUNTER — Encounter: Payer: Self-pay | Admitting: Family Medicine

## 2023-08-20 IMAGING — CT CT ABD-PELV W/O CM
1 of 2 series · 15 of 32 positions shown, 19 images · non-contrast
Comparison: None

CLINICAL DATA: Gross hematuria and lower pelvic pain for 1.5 weeks,
no history of kidney stones

EXAM:
CT ABDOMEN AND PELVIS WITHOUT CONTRAST
TECHNIQUE: Multidetector CT imaging of the abdomen and pelvis was performed
following the standard protocol without IV contrast. No oral
contrast.

[Series 2: axial st · axial · 0.75mm/px · z∈[-1255,-810]mm · 15 of 99 slices shown, 19 images]
[im 5/99  soft-tissue]
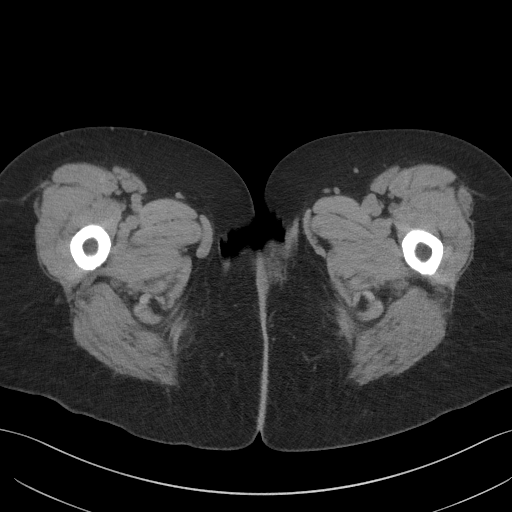
[im 5/99  bone]
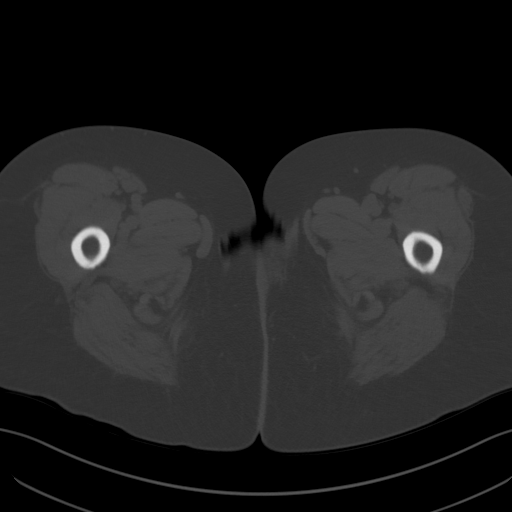
[im 13/99  soft-tissue]
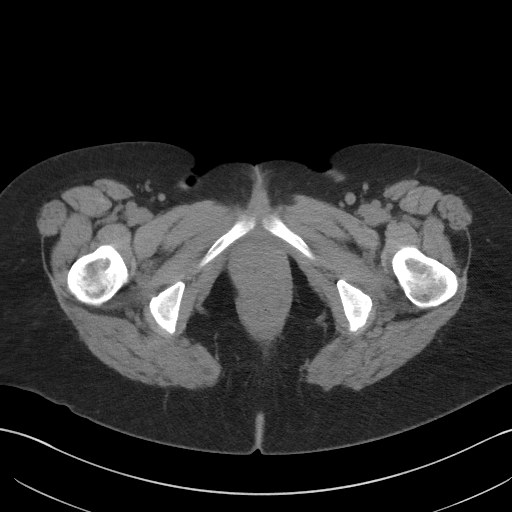
[im 21/99  soft-tissue]
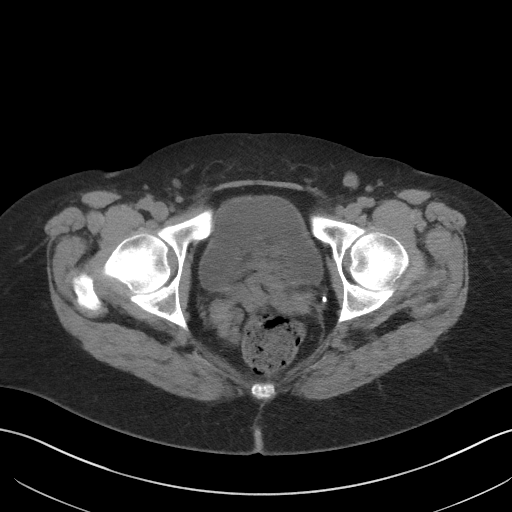
[im 29/99  soft-tissue]
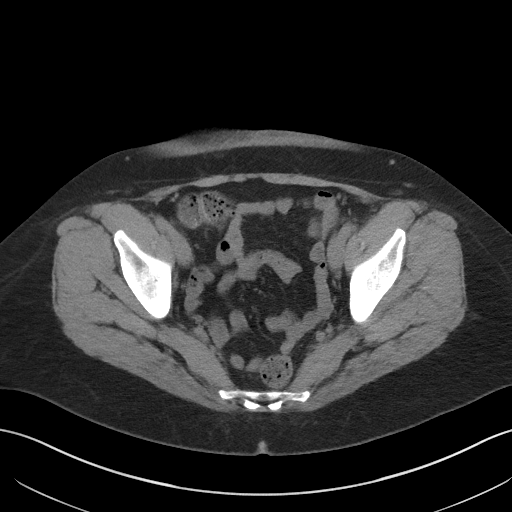
[im 33/99  soft-tissue]
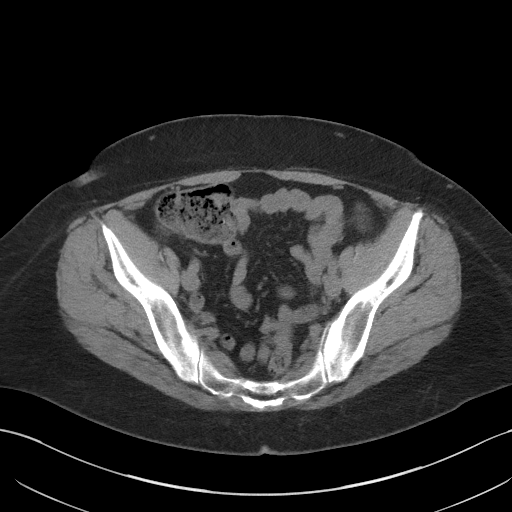
[im 41/99  soft-tissue]
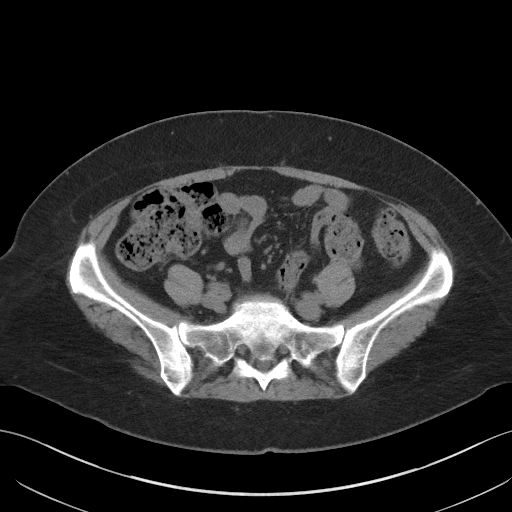
[im 50/99  soft-tissue]
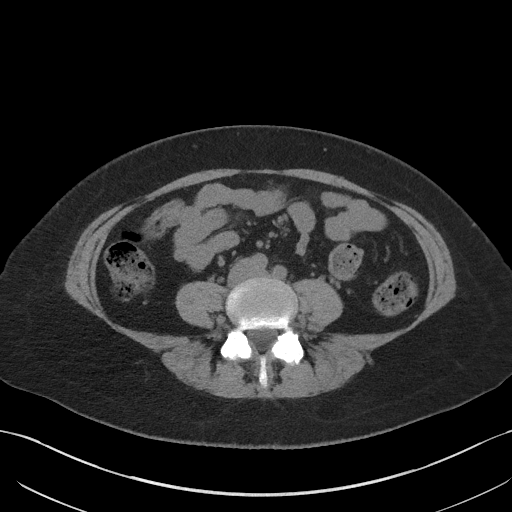
[im 58/99  soft-tissue]
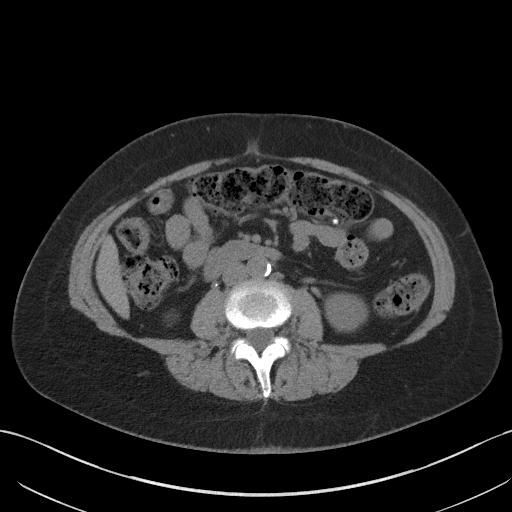
[im 66/99  soft-tissue]
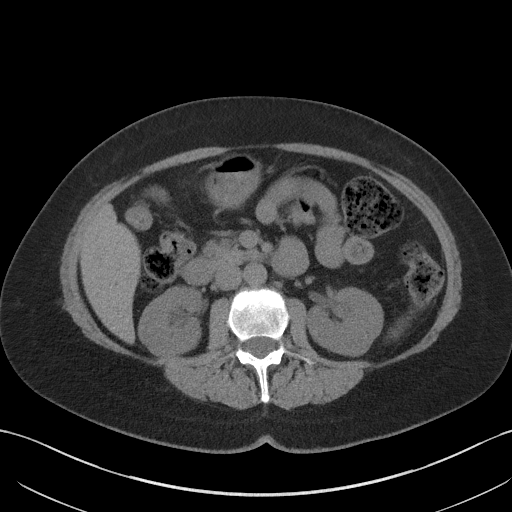
[im 66/99  bone]
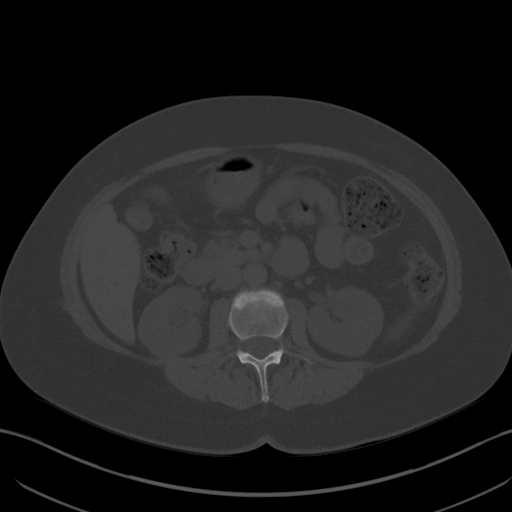
[im 70/99  soft-tissue]
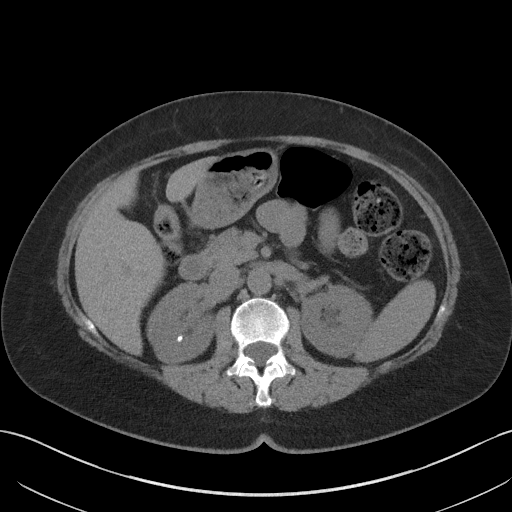
[im 78/99  soft-tissue]
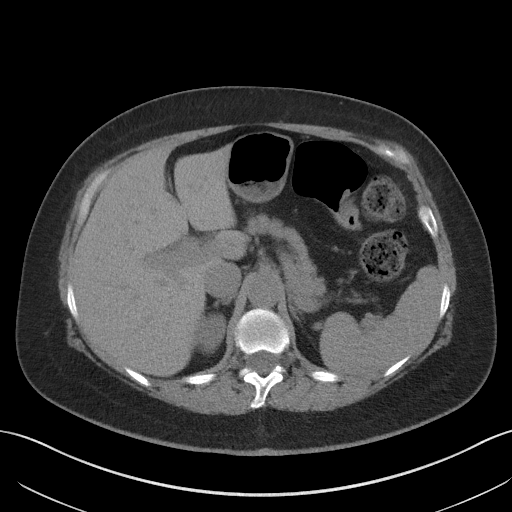
[im 82/99  lung]
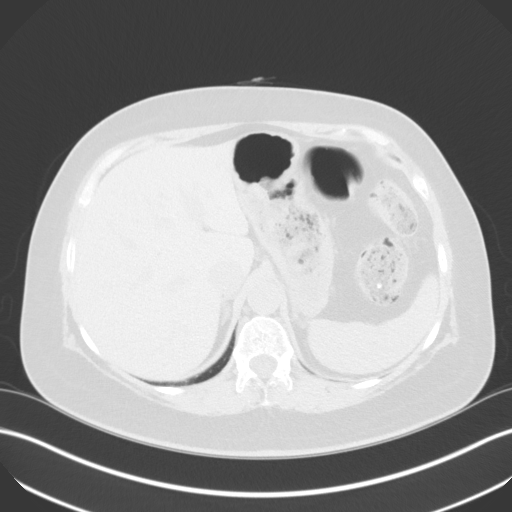
[im 86/99  soft-tissue]
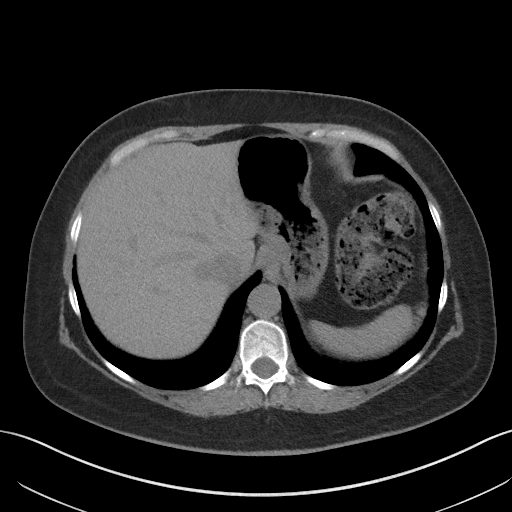
[im 86/99  lung]
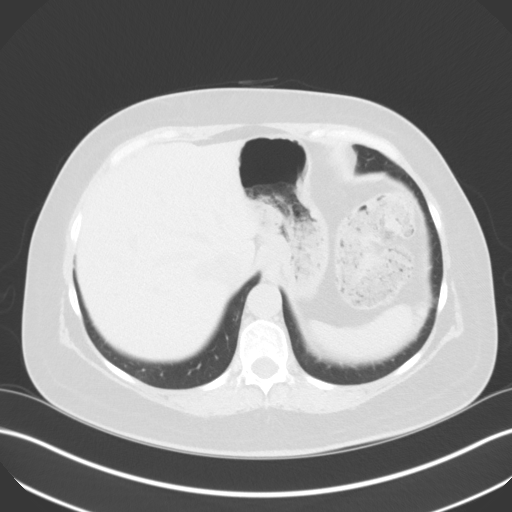
[im 90/99  lung]
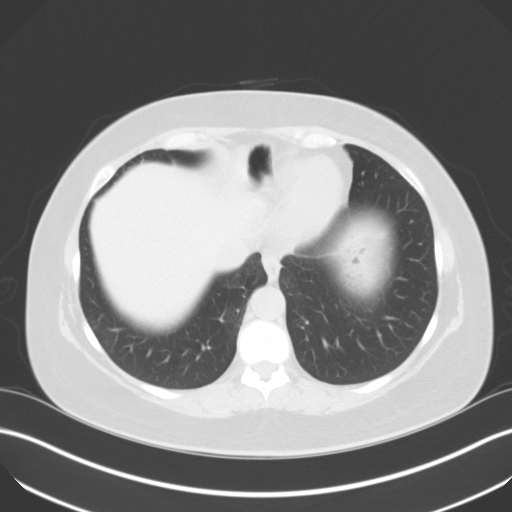
[im 94/99  soft-tissue]
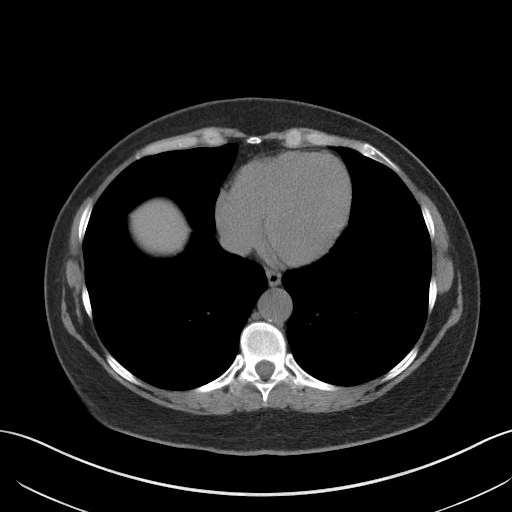
[im 94/99  lung]
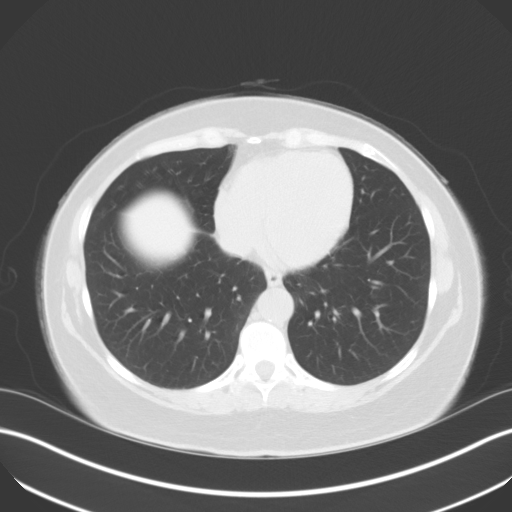

[15 of 32 positions shown; findings below may reference images not displayed]

FINDINGS: Lower chest: Lung bases clear

Hepatobiliary: Gallbladder surgically absent. Tiny nonspecific
low-attenuation focus RIGHT lobe liver 6 mm diameter. Remainder of
liver normal appearance.

Pancreas: Normal appearance

Spleen: Normal appearance

Adrenals/Urinary Tract: Adrenal glands normal appearance. 4 mm
nonobstructing mid RIGHT renal calculus. No renal masses. No
hydronephrosis, hydroureter, or ureteral calcification. Bladder
unremarkable.

Stomach/Bowel: Normal appendix. Stool throughout colon. Stomach and
bowel loops otherwise normal appearance

Vascular/Lymphatic: Atherosclerotic calcification aorta without
aneurysm. Small LEFT pelvic phleboliths. No adenopathy.

Reproductive: Uterus surgically absent. Nonvisualization of ovaries.

Other: No free air or free fluid. No hernia or inflammatory process.

Musculoskeletal: No acute osseous findings.
IMPRESSION: 4 mm nonobstructing mid RIGHT renal calculus.

No other intra-abdominal or intrapelvic abnormalities.

Aortic Atherosclerosis (CS51K-4NS.S).

## 2023-09-21 ENCOUNTER — Other Ambulatory Visit: Payer: Self-pay | Admitting: Family Medicine

## 2023-09-21 DIAGNOSIS — E559 Vitamin D deficiency, unspecified: Secondary | ICD-10-CM

## 2023-09-21 NOTE — Telephone Encounter (Signed)
Copied from CRM [id number]. Topic: Clinical - Medication Refill >> Sep 21, 2023 12:42 PM Efraim Kaufmann C wrote: Most Recent Primary Care Visit:  Provider: Dana Allan  Department: [facility]  Visit Type: PHYSICAL  Date: 08/10/2023  Medication: Vitamin D, Ergocalciferol, (DRISDOL) 1.25 MG (50000 UNIT) CAPS capsule  Has the patient contacted their pharmacy? Yes (Agent: If no, request that the patient contact the pharmacy for the refill. If patient does not wish to contact the pharmacy document the reason why and proceed with request.) (Agent: If yes, when and what did the pharmacy advise?)  Is this the correct pharmacy for this prescription? Yes If no, delete pharmacy and type the correct one.  This is the patient's preferred pharmacy:  [id number] - [address] [place] [address] Phone: [phone] Fax: [phone]   Has the prescription been filled recently? No  Is the patient out of the medication? No (patient has one pill left)  Has the patient been seen for an appointment in the last year OR does the patient have an upcoming appointment? Yes  Can we respond through MyChart? Yes  Agent: Please be advised that Rx refills may take up to 3 business days. We ask that you follow-up with your pharmacy.

## 2023-09-22 MED ORDER — VITAMIN D (ERGOCALCIFEROL) 1.25 MG (50000 UNIT) PO CAPS: 50000.0000 [IU] | ORAL_CAPSULE | ORAL | 0 refills | Status: DC

## 2023-10-16 ENCOUNTER — Other Ambulatory Visit: Payer: Self-pay | Admitting: Family Medicine

## 2023-10-16 DIAGNOSIS — Z1231 Encounter for screening mammogram for malignant neoplasm of breast: Secondary | ICD-10-CM

## 2023-11-20 ENCOUNTER — Emergency Department: Payer: 59

## 2023-11-20 ENCOUNTER — Other Ambulatory Visit: Payer: Self-pay

## 2023-11-20 ENCOUNTER — Emergency Department
Admission: EM | Admit: 2023-11-20 | Discharge: 2023-11-20 | Disposition: A | Discharge status: Home / Self Care | Payer: 59 | Attending: Emergency Medicine | Admitting: Emergency Medicine

## 2023-11-20 DIAGNOSIS — M7981 Nontraumatic hematoma of soft tissue: Secondary | ICD-10-CM | POA: Insufficient documentation

## 2023-11-20 DIAGNOSIS — S301XXA Contusion of abdominal wall, initial encounter: Secondary | ICD-10-CM

## 2023-11-20 DIAGNOSIS — R1032 Left lower quadrant pain: Secondary | ICD-10-CM | POA: Diagnosis present

## 2023-11-20 LAB — BASIC METABOLIC PANEL
Anion gap: 12 (ref 5–15)
BUN: 22 mg/dL — ABNORMAL HIGH (ref 6–20)
CO2: 23 mmol/L (ref 22–32)
Calcium: 9.1 mg/dL (ref 8.9–10.3)
Chloride: 99 mmol/L (ref 98–111)
Creatinine, Ser: 0.92 mg/dL (ref 0.44–1.00)
GFR, Estimated: >60 mL/min (ref >60)
Glucose, Bld: 148 mg/dL — ABNORMAL HIGH (ref 70–99)
Potassium: 3.8 mmol/L (ref 3.5–5.1)
Sodium: 134 mmol/L — ABNORMAL LOW (ref 135–145)

## 2023-11-20 LAB — URINALYSIS, ROUTINE W REFLEX MICROSCOPIC
Bilirubin Urine: NEGATIVE
Glucose, UA: NEGATIVE mg/dL
Hgb urine dipstick: NEGATIVE
Ketones, ur: NEGATIVE mg/dL
Leukocytes,Ua: NEGATIVE
Nitrite: NEGATIVE
Protein, ur: NEGATIVE mg/dL
Specific Gravity, Urine: 1.024 (ref 1.005–1.030)
pH: 5 (ref 5.0–8.0)

## 2023-11-20 LAB — CBC
HCT: 40.5 % (ref 36.0–46.0)
Hemoglobin: 12.8 g/dL (ref 12.0–15.0)
MCH: 29.7 pg (ref 26.0–34.0)
MCHC: 31.6 g/dL (ref 30.0–36.0)
MCV: 94 fL (ref 80.0–100.0)
Platelets: 271 10*3/uL (ref 150–400)
RBC: 4.31 MIL/uL (ref 3.87–5.11)
RDW: 13.2 % (ref 11.5–15.5)
WBC: 11 10*3/uL — ABNORMAL HIGH (ref 4.0–10.5)
nRBC: 0 % (ref 0.0–0.2)

## 2023-11-20 LAB — HEPATIC FUNCTION PANEL
ALT: 25 U/L (ref 0–44)
AST: 42 U/L — ABNORMAL HIGH (ref 15–41)
Albumin: 3.9 g/dL (ref 3.5–5.0)
Alkaline Phosphatase: 77 U/L (ref 38–126)
Bilirubin, Direct: 0.1 mg/dL (ref 0.0–0.2)
Indirect Bilirubin: 0.7 mg/dL (ref 0.3–0.9)
Total Bilirubin: 0.8 mg/dL (ref 0.0–1.2)
Total Protein: 7.5 g/dL (ref 6.5–8.1)

## 2023-11-20 LAB — LIPASE, BLOOD: Lipase: 38 U/L (ref 11–51)

## 2023-11-20 MED ORDER — ONDANSETRON HCL 4 MG/2ML IJ SOLN
4.0000 mg | Freq: Once | INTRAMUSCULAR | Status: AC
  Administered 2023-11-20: 4 mg via INTRAVENOUS
  Filled 2023-11-20: qty 2

## 2023-11-20 MED ORDER — ONDANSETRON HCL 4 MG PO TABS: 4.0000 mg | ORAL_TABLET | Freq: Four times a day (QID) | ORAL | 0 refills | Status: AC | PRN

## 2023-11-20 MED ORDER — HYDROCODONE-ACETAMINOPHEN 5-325 MG PO TABS: 1.0000 | ORAL_TABLET | Freq: Four times a day (QID) | ORAL | 0 refills | Status: DC | PRN

## 2023-11-20 MED ORDER — MORPHINE SULFATE (PF) 4 MG/ML IV SOLN
4.0000 mg | Freq: Once | INTRAVENOUS | Status: AC
  Administered 2023-11-20: 4 mg via INTRAVENOUS
  Filled 2023-11-20: qty 1

## 2023-11-20 MED ORDER — SODIUM CHLORIDE 0.9 % IV BOLUS
1000.0000 mL | Freq: Once | INTRAVENOUS | Status: AC
  Administered 2023-11-20: 1000 mL via INTRAVENOUS

## 2023-11-20 MED ORDER — KETOROLAC TROMETHAMINE 15 MG/ML IJ SOLN
15.0000 mg | Freq: Once | INTRAMUSCULAR | Status: AC
  Administered 2023-11-20: 15 mg via INTRAVENOUS
  Filled 2023-11-20: qty 1

## 2023-11-20 NOTE — ED Provider Notes (Signed)
[facility] Provider Note    Event Date/Time   First MD Initiated Contact with Patient 11/20/23 1500     (approximate)   History   Flank Pain   HPI  Candace Joseph is a 58 year old female with history of nephrolithiasis presenting to the emergency department for evaluation of left flank pain.  Around 2 PM today, patient had onset of left-sided flank and lower abdominal pain.  Associated nausea without vomiting.  No fevers or chills.  Feels similar to prior episodes of renal stones.  Has previously required lithotripsy.    Physical Exam   Triage Vital Signs: ED Triage Vitals [11/20/23 1322]  Encounter Vitals Group     BP 127/77     Systolic BP Percentile      Diastolic BP Percentile      Pulse Rate 88     Resp 18     Temp 98 F (36.7 C)     Temp src      SpO2 100 %     Weight 195 lb (88.5 kg)     Height 5\' 5"  (1.651 m)     Head Circumference      Peak Flow      Pain Score 10     Pain Loc      Pain Education      Exclude from Growth Chart     Most recent vital signs: Vitals:   11/20/23 1745 11/20/23 1752  BP: 100/70   Pulse: 60 (!) 58  Resp:  17  Temp: 97.9 F (36.6 C)   SpO2: 99% 99%     General: Awake, interactive  CV:  Regular rate, good peripheral perfusion.  Resp:  Unlabored respirations, lungs clear to auscultation Abd:  Nondistended, soft, tender palpation most notably in the left lower quadrant into the left flank without rebound or guarding Neuro:  Symmetric facial movement, fluid speech   ED Results / Procedures / Treatments   Labs (all labs ordered are listed, but only abnormal results are displayed) Labs Reviewed  URINALYSIS, ROUTINE W REFLEX MICROSCOPIC - Abnormal; Notable for the following components:      Result Value   Color, Urine YELLOW (*)    APPearance CLEAR (*)    All other components within normal limits  BASIC METABOLIC PANEL - Abnormal; Notable for the following components:   Sodium 134 (*)     Glucose, Bld 148 (*)    BUN 22 (*)    All other components within normal limits  CBC - Abnormal; Notable for the following components:   WBC 11.0 (*)    All other components within normal limits  HEPATIC FUNCTION PANEL - Abnormal; Notable for the following components:   AST 42 (*)    All other components within normal limits  LIPASE, BLOOD     EKG EKG independently reviewed interpreted by myself (ER attending) demonstrates:    RADIOLOGY Imaging independently reviewed and interpreted by myself demonstrates:  CT stone study does not demonstrate obstructing renal stone, but does demonstrate a large rectus sheath hematoma on the left  PROCEDURES:  Critical Care performed: No  Procedures   MEDICATIONS ORDERED IN ED: Medications  ondansetron (ZOFRAN) injection 4 mg (4 mg Intravenous Given 11/20/23 1605)  morphine (PF) 4 MG/ML injection 4 mg (4 mg Intravenous Given 11/20/23 1609)  sodium chloride 0.9 % bolus 1,000 mL (0 mLs Intravenous Stopped 11/20/23 1715)  ketorolac (TORADOL) 15 MG/ML injection 15 mg (15 mg Intravenous Given 11/20/23 1604)  IMPRESSION / MDM / ASSESSMENT AND PLAN / ED COURSE  I reviewed the triage vital signs and the nursing notes.  Differential diagnosis includes, but is not limited to, renal stone, UTI, pyelonephritis, diverticulitis, other acute intra-abdominal process  Patient's presentation is most consistent with acute presentation with potential threat to life or bodily function.  58 year old female presenting to the emergency department for evaluation of flank pain.  Stable vitals on presentation.  Labs from triage overall reassuring, mild leukocytosis noted.  Awaiting urinalysis, will also obtain CT stone study to further evaluate in the setting of her flank pain.  Will treat symptomatically with Toradol, morphine, fluids, Zofran.  CT did not demonstrate any obstructing renal stone, but did demonstrate a large left rectus sheath hematoma.  Patient  reevaluated.  No bruising at the skin level, but pain is consistent location of hematoma on CT.  She is not on any anticoagulation or antiplatelet agents.  She cannot remember any specific trauma.  Given size of hematoma, case was reviewed with Dr. Everlene Farrier with general surgery.  He reported that this likely would resorb on its own and did not recommend any further interventions in the ER.  He did recommend that the patient avoid any NSAIDs, aspirin, or other blood thinning agents until the hematoma had resolved.  On reevaluation, patient does report significant improvement in her pain.  I did discuss results of her workup.  She is comfortable with discharge.  Will DC with short course of narcotic pain medicine.  Strict return precautions provided.  Patient discharged stable condition.    FINAL CLINICAL IMPRESSION(S) / ED DIAGNOSES   Final diagnoses:  Rectus sheath hematoma, initial encounter     Rx / DC Orders   ED Discharge Orders          Ordered    HYDROcodone-acetaminophen (NORCO/VICODIN) 5-325 MG tablet  Every 6 hours PRN        11/20/23 1832    ondansetron (ZOFRAN) 4 MG tablet  Every 6 hours PRN        11/20/23 1832             Note:  This document was prepared using Dragon voice recognition software and may include unintentional dictation errors.   Trinna Post, MD 11/20/23 [phone]

## 2023-11-20 NOTE — ED Triage Notes (Signed)
Pt comes with left sided flank pain that started about hour ago while at work. Pt states hx of these. Pt states 10/10 pain . Pt states nausea. Pt states pain when trying to urinate.

## 2023-11-20 NOTE — Discharge Instructions (Signed)
You were seen in the ER today for evaluation of your abdominal pain.  Your CT showed that you have a hematoma in your abdominal wall.  This should improve on its own.  Avoid taking any NSAIDs such as ibuprofen, Motrin, Aleve, aspirin.  You can take Tylenol to help with your pain.  If you have breakthrough pain, I sent a short course of narcotic medicine to your pharmacy.  This can make you drowsy, do not drive or operate machinery.  If your symptoms are not improving, I have included as needed follow-up information for general surgery.

## 2023-11-25 ENCOUNTER — Encounter: Payer: Self-pay | Admitting: Surgery

## 2023-11-25 ENCOUNTER — Ambulatory Visit (INDEPENDENT_AMBULATORY_CARE_PROVIDER_SITE_OTHER): Payer: Self-pay | Admitting: Surgery

## 2023-11-25 VITALS — BP 133/77 | HR 80 | Temp 98.6°F | Ht 65.0 in | Wt 199.4 lb

## 2023-11-25 DIAGNOSIS — M7981 Nontraumatic hematoma of soft tissue: Secondary | ICD-10-CM | POA: Diagnosis not present

## 2023-11-25 NOTE — Patient Instructions (Signed)
 Hematoma A hematoma is a collection of blood. A hematoma can happen: Under the skin. In an organ. In a body space. In a joint space. In other tissues. The blood can thicken (clot) to form a lump that you can see and feel. The lump is often hard and may become sore and tender. The lump can be very small or very big. Most hematomas get better in a few days to weeks. However, some of these may be serious and need medical care. What are the causes? This condition is caused by: An injury. Blood that leaks under the skin. Problems from surgeries. Medical conditions that cause bleeding or bruising. What increases the risk? You are more likely to develop this condition if: You are an older adult. You use medicines that thin your blood. You use NSAIDs, such as ibuprofen, often for pain. You play contact sports. What are the signs or symptoms? Symptoms depend on where the hematoma is in your body. If the hematoma is under the skin, there is: A firm lump on the body. Pain and tenderness in the area. Bruising. The skin above the lump may be blue, dark blue, purple-red, or yellowish. If the hematoma is deep in the tissues or body spaces, there may be: Blood in the stomach. This may cause pain in the belly (abdomen), weakness, passing out (fainting), and shortness of breath. Blood in the head. This may cause a headache, weakness, trouble speaking or understanding speech, or passing out. How is this treated? Treatment depends on the cause, size, and location of the hematoma. Treatment may include: Doing nothing. Many hematomas go away on their own without treatment. Surgery or close monitoring. This may be needed for large hematomas or hematomas that affect the body's organs. Medicines. These may be given if a medical condition caused the hematoma. Follow these instructions at home: Managing pain, stiffness, and swelling  If told, put ice on the injured area. To do this: Put ice in a plastic  bag. Place a towel between your skin and the bag. Leave the ice on for 20 minutes, 2-3 times a day for the first two days. If your skin turns bright red, take off the ice right away to prevent skin damage. The risk of skin damage is higher if you cannot feel pain, heat, or cold. If told, put heat on the injured area. Do this as often as told by your doctor. Use the heat source that your doctor recommends, such as a moist heat pack or a heating pad. Place a towel between your skin and the heat source. Leave the heat on for 20-30 minutes. If your skin turns bright red, take off the heat right away to prevent burns. The risk of burns is higher if you cannot feel pain, heat, or cold. Raise the injured area above the level of your heart while you are sitting or lying down. Wrap the affected area with an elastic bandage, if told by your doctor. Do not wrap the bandage too tight. If your hematoma is on a leg or foot and is painful, your doctor may give you crutches. Use them as told by your doctor. General instructions Take over-the-counter and prescription medicines only as told by your doctor. Keep all follow-up visits. Your doctor may want to see how your hematoma is healing with treatment. Contact a doctor if: You have a fever. The swelling or bruising gets worse. You start to get more hematomas. Your pain gets worse. Your pain is not getting better  with medicine. The skin over the hematoma breaks or starts to bleed. Get help right away if: Your hematoma is in your chest or belly and you: Pass out. Feel weak. Become short of breath. You have a hematoma on your scalp that is caused by a fall or injury, and you: Have a headache that gets worse. Have trouble speaking or understanding speech. Become less alert or you pass out. These symptoms may be an emergency. Get help right away. Call 911. Do not wait to see if the symptoms will go away. Do not drive yourself to the hospital This  information is not intended to replace advice given to you by your health care provider. Make sure you discuss any questions you have with your health care provider. Document Revised: 03/10/2022 Document Reviewed: 03/10/2022 Elsevier Patient Education  2024 [employer].

## 2023-11-25 NOTE — Progress Notes (Signed)
 Patient ID: Candace Joseph, female   DOB: 1966/05/17, 58 y.o.   MRN: [id number]  HPI Candace Joseph is a 58 y.o. female seen in consultation at the request of Dr. Rosalia Hammers.  5 days ago she presented to the emergency room with an acute onset of pain.  Patient reports that the pain started while she was in school.  SHe was with a Systems analyst.  She teaches second grade. Pain was sharp, deep moderate to severe intensity not specific alleviating or aggravating factors.  She does have associated a presyncopal episode happened while she was at emergency room.  She did have appropriate workup in the emergency room including a CT the scan Personally reviewed and also shared the images with the patient.  There is a good-sized left rectus sheath hematoma.  There is no evidence of any other trauma to the abdominal cavity or surrounding tissues.  Her labs were reassuring other than mild hyperglycemia.  CBC and CMP was normal. She reports that the pain is slowly getting better.   HPI  Past Medical History:  Diagnosis Date   COVID-19    10/23/20   Flu    2022   PONV (postoperative nausea and vomiting)    Right kidney stone    Sinusitis    07/29/21 sinusitis + flu seen tx'ed urgent care tamiflu and augmentin   Skin lesion 08/13/2018   Vitiligo    arms, legs FH vitiligo   Well adult exam 03/27/2020    Past Surgical History:  Procedure Laterality Date   ABDOMINAL HYSTERECTOMY     for endometriosis no h/o abnormal total ovaries uterus,cervix out    BREAST REDUCTION SURGERY     2001   CESAREAN SECTION     2000   CHOLECYSTECTOMY     EXTRACORPOREAL SHOCK WAVE LITHOTRIPSY Right 10/17/2021   Procedure: EXTRACORPOREAL SHOCK WAVE LITHOTRIPSY (ESWL);  Surgeon: Riki Altes, MD;  Location: [facility] ORS;  Service: Urology;  Laterality: Right;   REDUCTION MAMMAPLASTY Bilateral 2001    Family History  Problem Relation Age of Onset   Heart disease Mother    Thyroid disease Mother        hypothyroidism    Heart disease Father    Hearing loss Father    Stroke Father    Kidney Stones Father    Birth defects Maternal Grandfather    Heart disease Maternal Grandfather    Stroke Maternal Grandfather    Birth defects Paternal Grandmother    Heart disease Paternal Grandfather    Kidney Stones Son    Breast cancer Neg Hx     Social History Social History   Tobacco Use   Smoking status: Never   Smokeless tobacco: Never  Substance Use Topics   Alcohol use: Not Currently   Drug use: Never    No Known Allergies  Current Outpatient Medications  Medication Sig Dispense Refill   Ascorbic Acid (VITAMIN C) 1000 MG tablet Take 1,000 mg by mouth daily.     estradiol (ESTRACE VAGINAL) 0.1 MG/GM vaginal cream Apply 0.5mg  (pea-sized amount)  just inside the vaginal introitus with a finger-tip on Monday, Wednesday and Friday nights. 30 g 12   ondansetron (ZOFRAN) 4 MG tablet Take 1 tablet (4 mg total) by mouth every 6 (six) hours as needed for up to 7 days for nausea or vomiting. 20 tablet 0   ondansetron (ZOFRAN-ODT) 4 MG disintegrating tablet Take 1 tablet (4 mg total) by mouth every 8 (eight) hours as needed for  nausea or vomiting. 20 tablet 0   Vitamin D, Ergocalciferol, (DRISDOL) 1.25 MG (50000 UNIT) CAPS capsule Take 1 capsule (50,000 Units total) by mouth every 7 (seven) days. 12 capsule 0   No current facility-administered medications for this visit.     Review of Systems Full ROS  was asked and was negative except for the information on the HPI  Physical Exam Blood pressure 133/77, pulse 80, temperature 98.6 F (37 C), temperature source Oral, height 5\' 5"  (1.651 m), weight 199 lb 6.4 oz (90.4 kg), SpO2 96%. CONSTITUTIONAL: NAD. EYES: Pupils are equal, round, Sclera are non-icteric. EARS, NOSE, MOUTH AND THROAT: The oropharynx is clear. The oral mucosa is pink and moist. Hearing is intact to voice. LYMPH NODES:  Lymph nodes in the neck are normal. RESPIRATORY:  Lungs are clear. There  is normal respiratory effort, with equal breath sounds bilaterally, and without pathologic use of accessory muscles. CARDIOVASCULAR: Heart is regular without murmurs, gallops, or rubs. GI: The abdomen is  soft, mild tenderness to palpation quadrant.  There is some fullness and some induration but no evidence of expanding hematoma no evidence of skin changes or discoloration.  There are no palpable masses. There is no hepatosplenomegaly. There are normal bowel sounds GU: Rectal deferred.   MUSCULOSKELETAL: Normal muscle strength and tone. No cyanosis or edema.   SKIN: Turgor is good and there are no pathologic skin lesions or ulcers. NEUROLOGIC: Motor and sensation is grossly normal. Cranial nerves are grossly intact. PSYCH:  Oriented to person, place and time. Affect is normal.  Data Reviewed  I have personally reviewed the patient's imaging, laboratory findings and medical records.    Assessment/Plan 58 year old very pleasant female with unprovoked left rectus sheath hematoma. No evidence of complications such as infection or expansion.  Good discussion with the patient regarding her disease process.  No need for any further testing or any surgical intervention. Reassurance provided to the patient. I spent 45 minutes encounter including medical records, imaging studies personally, placing orders, counseling the patient vitamin D.  Patient.  A copy of this report was sent to the referring provider   Sterling Big, MD FACS General Surgeon 11/25/2023, 10:05 AM

## 2023-12-01 ENCOUNTER — Ambulatory Visit
Admission: RE | Admit: 2023-12-01 | Discharge: 2023-12-01 | Disposition: A | Discharge status: Home / Self Care | Payer: Self-pay | Source: Ambulatory Visit | Attending: Family Medicine | Admitting: Family Medicine

## 2023-12-01 DIAGNOSIS — Z1231 Encounter for screening mammogram for malignant neoplasm of breast: Secondary | ICD-10-CM | POA: Insufficient documentation

## 2023-12-14 ENCOUNTER — Other Ambulatory Visit: Payer: Self-pay | Admitting: Family Medicine

## 2023-12-14 DIAGNOSIS — E559 Vitamin D deficiency, unspecified: Secondary | ICD-10-CM

## 2024-01-11 ENCOUNTER — Ambulatory Visit: Admitting: Family Medicine

## 2024-03-03 ENCOUNTER — Other Ambulatory Visit: Payer: Self-pay | Admitting: Family Medicine

## 2024-03-03 DIAGNOSIS — E559 Vitamin D deficiency, unspecified: Secondary | ICD-10-CM

## 2024-03-03 DIAGNOSIS — N200 Calculus of kidney: Secondary | ICD-10-CM

## 2024-03-06 ENCOUNTER — Other Ambulatory Visit: Payer: Self-pay | Admitting: Family Medicine

## 2024-03-06 DIAGNOSIS — E559 Vitamin D deficiency, unspecified: Secondary | ICD-10-CM

## 2024-04-21 ENCOUNTER — Encounter: Admitting: Nurse Practitioner

## 2024-04-22 ENCOUNTER — Encounter: Payer: Self-pay | Admitting: Nurse Practitioner

## 2024-04-22 ENCOUNTER — Ambulatory Visit: Admitting: Nurse Practitioner

## 2024-04-22 VITALS — BP 122/78 | HR 79 | Temp 98.4°F | Ht 65.0 in | Wt 197.0 lb

## 2024-04-22 DIAGNOSIS — F439 Reaction to severe stress, unspecified: Secondary | ICD-10-CM | POA: Diagnosis not present

## 2024-04-22 DIAGNOSIS — E559 Vitamin D deficiency, unspecified: Secondary | ICD-10-CM | POA: Diagnosis not present

## 2024-04-22 DIAGNOSIS — S301XXD Contusion of abdominal wall, subsequent encounter: Secondary | ICD-10-CM | POA: Diagnosis not present

## 2024-04-22 DIAGNOSIS — E063 Autoimmune thyroiditis: Secondary | ICD-10-CM | POA: Diagnosis not present

## 2024-04-22 NOTE — Progress Notes (Unsigned)
 Established Patient Office Visit  Subjective:  Patient ID: Candace Joseph, female    DOB: [birth year]  Age: 58 y.o. MRN: [id number]  CC:  Chief Complaint  Patient presents with   Establish Care    Transfer of Cae- Dr. Hope left the practice  Patient is wondering how to tell if she still has the Hematoma on her left side   Discussed the use of a AI scribe software for clinical note transcription with the patient, who gave verbal consent to proceed.   HPI  Candace Joseph presents for [facility], her previous PCP was Dr. Hope.   Hashimoto's thyroiditis Asymptomatic.  Not currently on medication.   Nephrolithiasis: She has a history of kidney stones but no recent flare-ups. She follows a regimen prescribed by her urologist, including a vaginal cream for UTIs three times a week, and has not had a UTI in over a year. She takes prescription vitamin D , probiotics, vitamin C, and Zofran  as needed. Dietary changes include reducing soda intake and adding lemon to her water.  Hashimoto's thyroiditis Asymptomatic.  Not currently on medication.   Caregiver stress: Her husband was recently diagnosed with stroke which has been causing significant stress at home. She is starting to see counselor for caregiver support.   Rectus sheath hematoma: She experiences intermittent pain at the site of a previous hematoma, which occurred at the beginning of the year.  The pain occurs randomly, approximately five or six times since her last consultation, lasting a few minutes each time. Stretching and applying pressure help relieve the pain, and she has not needed medication for it.  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 She does not consume alcohol or tobacco and has no known allergies. She is experiencing caregiver stress as her husband recently had a basal ganglia stroke. She has started seeing a counselor for support and feels fatigued and  stressed, attributing this to her caregiving responsibilities.  HPI   Past Medical History:  Diagnosis Date   COVID-19    10/23/20   Flu    2022   PONV (postoperative nausea and vomiting)    Right kidney stone    Sinusitis    07/29/21 sinusitis + flu seen tx'ed urgent care tamiflu  and augmentin   Skin lesion 08/13/2018   Vitiligo    arms, legs FH vitiligo   Well adult exam 03/27/2020    Past Surgical History:  Procedure Laterality Date   ABDOMINAL HYSTERECTOMY     for endometriosis no h/o abnormal total ovaries uterus,cervix out    BREAST REDUCTION SURGERY     2001   CESAREAN SECTION     2000   CHOLECYSTECTOMY     EXTRACORPOREAL SHOCK WAVE LITHOTRIPSY Right 10/17/2021   Procedure: EXTRACORPOREAL SHOCK WAVE LITHOTRIPSY (ESWL);  Surgeon: Twylla Glendia BROCKS, MD;  Location: [facility] ORS;  Service: Urology;  Laterality: Right;   REDUCTION MAMMAPLASTY Bilateral 2001    Family History  Problem Relation Age of Onset   Thyroid  disease Mother        hypothyroidism   Heart disease Mother        CHF   Heart disease Father    Hearing loss Father    Stroke Father    Kidney Stones Father    Birth defects Maternal Grandfather    Heart disease Maternal Grandfather    Stroke Maternal Grandfather    Birth defects Paternal Grandmother  Heart disease Paternal Grandfather    Kidney Stones Daughter    Breast cancer Neg Hx     Social History   Socioeconomic History   Marital status: Married    Spouse name: Not on file   Number of children: Not on file   Years of education: Not on file   Highest education level: Master's degree (e.g., MA, MS, MEng, MEd, MSW, MBA)  Occupational History   Not on file  Tobacco Use   Smoking status: Never   Smokeless tobacco: Never  Substance and Sexual Activity   Alcohol use: Not Currently   Drug use: Never   Sexual activity: Yes    Birth control/protection: None  Other Topics Concern   Not on file  Social History Narrative   2 kids son and  daughter    Midwife    Married    Owns guns, wears seat belt, safe in relationship   Social Drivers of Health   Financial Resource Strain: Low Risk  (04/21/2024)   Overall Financial Resource Strain (CARDIA)    Difficulty of Paying Living Expenses: Not hard at all  Food Insecurity: No Food Insecurity (04/21/2024)   Hunger Vital Sign    Worried About Running Out of Food in the Last Year: Never true    Ran Out of Food in the Last Year: Never true  Transportation Needs: No Transportation Needs (04/21/2024)   PRAPARE - Administrator, Civil Service (Medical): No    Lack of Transportation (Non-Medical): No  Physical Activity: Unknown (04/21/2024)   Exercise Vital Sign    Days of Exercise per Week: Patient declined    Minutes of Exercise per Session: Not on file  Stress: No Stress Concern Present (04/21/2024)   [employer] of Occupational Health - Occupational Stress Questionnaire    Feeling of Stress: Only a little  Social Connections: Unknown (04/21/2024)   Social Connection and Isolation Panel    Frequency of Communication with Friends and Family: More than three times a week    Frequency of Social Gatherings with Friends and Family: Once a week    Attends Religious Services: More than 4 times per year    Active Member of [employer] or Organizations: Patient declined    Attends Engineer, structural: Not on file    Marital Status: Married  Catering manager Violence: Not on file     Outpatient Medications Prior to Visit  Medication Sig Dispense Refill   Ascorbic Acid (VITAMIN C) 1000 MG tablet Take 1,000 mg by mouth daily.     estradiol  (ESTRACE  VAGINAL) 0.1 MG/GM vaginal cream Apply 0.5mg  (pea-sized amount)  just inside the vaginal introitus with a finger-tip on Monday, Wednesday and Friday nights. 30 g 12   ondansetron  (ZOFRAN -ODT) 4 MG disintegrating tablet TAKE 1 TABLET BY MOUTH EVERY 8 HOURS AS NEEDED FOR NAUSEA AND VOMITING 18 tablet 1   Probiotic  Product (PROBIOTIC PO) Take by mouth.     Vitamin D , Ergocalciferol , (DRISDOL ) 1.25 MG (50000 UNIT) CAPS capsule TAKE 1 CAPSULE (50,000 UNITS TOTAL) BY MOUTH EVERY 7 (SEVEN) DAYS 12 capsule 0   No facility-administered medications prior to visit.    No Known Allergies  ROS Review of Systems Negative unless indicated in HPI.    Objective:    Physical Exam Constitutional:      Appearance: Normal appearance.  HENT:     Mouth/Throat:     Mouth: Mucous membranes are moist.  Eyes:     Conjunctiva/sclera: Conjunctivae  normal.     Pupils: Pupils are equal, round, and reactive to light.  Cardiovascular:     Rate and Rhythm: Normal rate and regular rhythm.     Pulses: Normal pulses.     Heart sounds: Normal heart sounds.  Pulmonary:     Effort: Pulmonary effort is normal.     Breath sounds: Normal breath sounds.  Abdominal:     General: Bowel sounds are normal.     Palpations: Abdomen is soft.  Musculoskeletal:     Cervical back: Normal range of motion. No tenderness.  Skin:    General: Skin is warm.     Findings: No bruising.  Neurological:     General: No focal deficit present.     Mental Status: She is alert and oriented to person, place, and time. Mental status is at baseline.  Psychiatric:        Mood and Affect: Mood normal.        Behavior: Behavior normal.        Thought Content: Thought content normal.        Judgment: Judgment normal.     BP 122/78   Pulse 79   Temp 98.4 F (36.9 C)   Ht 5' 5 (1.651 m)   Wt 197 lb (89.4 kg)   SpO2 95%   BMI 32.78 kg/m  Wt Readings from Last 3 Encounters:  04/22/24 197 lb (89.4 kg)  11/25/23 199 lb 6.4 oz (90.4 kg)  11/20/23 195 lb (88.5 kg)     Health Maintenance  Topic Date Due   Pneumococcal Vaccine: 50+ Years (1 of 1 - PCV) Never done   INFLUENZA VACCINE  04/29/2024   COVID-19 Vaccine (3 - 2024-25 season) 05/07/2024 (Originally 05/31/2023)   Cervical Cancer Screening (HPV/Pap Cotest)  08/09/2024 (Originally  08/15/1996)   HIV Screening  08/12/2024 (Originally 08/15/1981)   MAMMOGRAM  11/30/2024   Colonoscopy  11/07/2026   DTaP/Tdap/Td (2 - Td or Tdap) 03/29/2031   Hepatitis B Vaccines  Completed   Hepatitis C Screening  Completed   Zoster Vaccines- Shingrix   Completed   HPV VACCINES  Aged Out   Meningococcal B Vaccine  Aged Out    There are no preventive care reminders to display for this patient.  Lab Results  Component Value Date   TSH 4.42 04/08/2023   Lab Results  Component Value Date   WBC 11.0 (H) 11/20/2023   HGB 12.8 11/20/2023   HCT 40.5 11/20/2023   MCV 94.0 11/20/2023   PLT 271 11/20/2023   Lab Results  Component Value Date   NA 134 (L) 11/20/2023   K 3.8 11/20/2023   CO2 23 11/20/2023   GLUCOSE 148 (H) 11/20/2023   BUN 22 (H) 11/20/2023   CREATININE 0.92 11/20/2023   BILITOT 0.8 11/20/2023   ALKPHOS 77 11/20/2023   AST 42 (H) 11/20/2023   ALT 25 11/20/2023   PROT 7.5 11/20/2023   ALBUMIN 3.9 11/20/2023   CALCIUM 9.1 11/20/2023   ANIONGAP 12 11/20/2023   GFR 81.02 04/08/2023   Lab Results  Component Value Date   CHOL 169 04/08/2023   Lab Results  Component Value Date   HDL 59.10 04/08/2023   Lab Results  Component Value Date   LDLCALC 96 04/08/2023   Lab Results  Component Value Date   TRIG 71.0 04/08/2023   Lab Results  Component Value Date   CHOLHDL 3 04/08/2023   Lab Results  Component Value Date   HGBA1C 5.2 04/08/2023  Assessment & Plan:  Vitamin D  deficiency Assessment & Plan: Continue current medication -Will repeat levels at next visit.    Hashimoto's thyroiditis Assessment & Plan: Chronic stable at present. -No medication.   Stress at home Assessment & Plan: Experiencing stress related to caregiving. She has been scheduled to see a counselor for caregiver support    Rectus sheath hematoma, subsequent encounter Assessment & Plan: Intermittent pain at previous hematoma site. Pain relieved by stretching and  pressure. NSAIDs avoided due to bleeding risk. - Advise continued use of heating pad and stretching for pain relief.     Follow-up: Return in about 4 months (around 08/23/2024) for follow up with fasting lab 2 days prior.   Colonel Krauser, NP

## 2024-05-04 DIAGNOSIS — F439 Reaction to severe stress, unspecified: Secondary | ICD-10-CM | POA: Insufficient documentation

## 2024-05-04 DIAGNOSIS — S301XXD Contusion of abdominal wall, subsequent encounter: Secondary | ICD-10-CM | POA: Insufficient documentation

## 2024-05-04 NOTE — Assessment & Plan Note (Signed)
 Experiencing stress related to caregiving. She has been scheduled to see a counselor for caregiver support

## 2024-05-04 NOTE — Assessment & Plan Note (Signed)
 Continue current medication -Will repeat levels at next visit.

## 2024-05-04 NOTE — Assessment & Plan Note (Signed)
 Chronic stable at present. -No medication.

## 2024-05-04 NOTE — Assessment & Plan Note (Signed)
 Intermittent pain at previous hematoma site. Pain relieved by stretching and pressure. NSAIDs avoided due to bleeding risk. - Advise continued use of heating pad and stretching for pain relief.

## 2024-05-24 ENCOUNTER — Telehealth: Payer: Self-pay

## 2024-05-24 NOTE — Telephone Encounter (Signed)
 This Patient is requesting a refill on vitamin D2.

## 2024-05-25 NOTE — Telephone Encounter (Signed)
 Called Patient and she is agreeable to take the Vitamin D  supplement 2000 international units daily and to repeat the vitamin D  on   08/19/24 to she if she will need the high dose weekly vitamin D  again.

## 2024-05-25 NOTE — Telephone Encounter (Signed)
 Pt can take OTC vit D supplement 2000 international units  daily. We will repeat the labs before her next appointment to recheck the levels before starting on weekly high dose.

## 2024-05-30 ENCOUNTER — Encounter: Payer: Self-pay | Admitting: Nurse Practitioner

## 2024-06-30 ENCOUNTER — Encounter: Admitting: Nurse Practitioner

## 2024-07-23 ENCOUNTER — Other Ambulatory Visit: Payer: Self-pay | Admitting: Urology

## 2024-07-23 DIAGNOSIS — N952 Postmenopausal atrophic vaginitis: Secondary | ICD-10-CM

## 2024-07-26 ENCOUNTER — Other Ambulatory Visit: Payer: Self-pay | Admitting: Urology

## 2024-07-26 DIAGNOSIS — N200 Calculus of kidney: Secondary | ICD-10-CM

## 2024-07-26 NOTE — Progress Notes (Unsigned)
 07/29/24 7:55 AM   Candace Joseph 06-19-1966 [id number]  Referring provider:  Hope Merle, MD No address on file  Urological history  Nephrolithiasis  - A CT was ordered on 10/03/2021 by her PCP for her persistent back pain, and showed a 5 mm nonobstructing right mid pole renal stone - Underwent ESWL on 10/17/2021 for right renal stone with Dr. Twylla.  - KUB on 11/11/2021 visualized renal stone no longer visualized.  Lower abdominal and pelvic calcifications consistent phleboliths  -CT renal stone study (05/2022) - 4 mm stone identified within the upper pole collecting system of the right kidney -KUB (11/2022) - no stones seen, but image largest obscured by overlying gas and stool  -CT renal stone study (10/2023) -small right renal calculi noted  Chief Complaint  Patient presents with   Nephrolithiasis   Recurrent UTI     HPI: Candace Joseph is a 58 y.o.female who presents today for three months follow up.   Previous records reviewed.    She is having 1-7 daytime voids, 1-2 episodes of nocturia, strong urge to urinate.  She does not have urinary leakage.  She does not wear products.  Rare leakage.  She does limit fluid intake.  She does engage in toilet mapping.  For the last 2 weeks, she has been having some burning that is relieved by urination.  She attributes it to coming off her prescription vitamin D .  She states that she is continuing to use the estrogen cream 3 nights weekly.  Patient denies any modifying or aggravating factors.  Patient denies any recent UTI's, gross hematuria or suprapubic/flank pain.  Patient denies any fevers, chills, nausea or vomiting.    UA yellow clear, specific gravity 1.010, pH 6.0, 0-5 WBCs, no RBCs, 0-10 epithelial cells and moderate bacteria.  KUB stable right renal stone   PMH: Past Medical History:  Diagnosis Date   COVID-19    10/23/20   Flu    2022   PONV (postoperative nausea and vomiting)    Right kidney stone     Sinusitis    07/29/21 sinusitis + flu seen tx'ed urgent care tamiflu  and augmentin   Skin lesion 08/13/2018   Vitiligo    arms, legs FH vitiligo   Well adult exam 03/27/2020    Surgical History: Past Surgical History:  Procedure Laterality Date   ABDOMINAL HYSTERECTOMY     for endometriosis no h/o abnormal total ovaries uterus,cervix out    BREAST REDUCTION SURGERY     2001   CESAREAN SECTION     2000   CHOLECYSTECTOMY     EXTRACORPOREAL SHOCK WAVE LITHOTRIPSY Right 10/17/2021   Procedure: EXTRACORPOREAL SHOCK WAVE LITHOTRIPSY (ESWL);  Surgeon: Twylla Glendia BROCKS, MD;  Location: [facility] ORS;  Service: Urology;  Laterality: Right;   REDUCTION MAMMAPLASTY Bilateral 2001    Home Medications:  Allergies as of 07/28/2024   No Known Allergies      Medication List        Accurate as of July 28, 2024 11:59 PM. If you have any questions, ask your nurse or doctor.          estradiol  0.01 % Crea vaginal cream Commonly known as: ESTRACE  APPLY 0.5MG  INSIDE THE VAGINAL INTROITUS WITH A FINGER-TIP ON MONDAY, WEDNESDAY AND FRIDAY NIGHTS.   ondansetron  4 MG disintegrating tablet Commonly known as: ZOFRAN -ODT TAKE 1 TABLET BY MOUTH EVERY 8 HOURS AS NEEDED FOR NAUSEA AND VOMITING   PROBIOTIC PO Take by mouth.   vitamin C  1000 MG tablet Take 1,000 mg by mouth daily.   Vitamin D  50 MCG (2000 UT) Caps Take by mouth daily.        Allergies:  No Known Allergies  Family History: Family History  Problem Relation Age of Onset   Thyroid  disease Mother        hypothyroidism   Heart disease Mother        CHF   Heart disease Father    Hearing loss Father    Stroke Father    Kidney Stones Father    Birth defects Maternal Grandfather    Heart disease Maternal Grandfather    Stroke Maternal Grandfather    Birth defects Paternal Grandmother    Heart disease Paternal Grandfather    Kidney Stones Daughter    Breast cancer Neg Hx     Social History:  reports that she has  never smoked. She has never used smokeless tobacco. She reports that she does not currently use alcohol. She reports that she does not use drugs.   Physical Exam: BP 127/81   Pulse 77   Ht 5' 5 (1.651 m)   Wt 200 lb (90.7 kg)   BMI 33.28 kg/m   Constitutional:  Well nourished. Alert and oriented, No acute distress. HEENT: [place] AT, moist mucus membranes.  Trachea midline Cardiovascular: No clubbing, cyanosis, or edema. Respiratory: Normal respiratory effort, no increased work of breathing. Neurologic: Grossly intact, no focal deficits, moving all 4 extremities. Psychiatric: Normal mood and affect.    Laboratory Data: See Epic and HPI   I have reviewed the labs.  See HPI.     Pertinent Imaging: KUB, radiologist interpretation still pending I have independently reviewed the films.  See HPI.    Assessment & Plan:    1. rUTI's - She is experiencing some dysuria - UA not very impressive except for moderate bacteria, I will go ahead and send it for culture and abundance of caution - She will also discuss the vitamin D  supplement further with her PCP at her appointment next month as she feels when she came off the vitamin D  that is when the burning started     - She will continue her vaginal estrogen cream 3 nights weekly                     2. Nephrolithiasis -Asymptomatic at today's visit - Stable right renal calculus  3. GSM -continue vaginal estrogen cream   Return for Follow up pending labs.   Candace Joseph   [facility] [address] [place], [place] [address] [phone]

## 2024-07-28 ENCOUNTER — Ambulatory Visit: Payer: Self-pay | Admitting: Urology

## 2024-07-28 ENCOUNTER — Ambulatory Visit
Admission: RE | Admit: 2024-07-28 | Discharge: 2024-07-28 | Disposition: A | Discharge status: Home / Self Care | Source: Ambulatory Visit | Attending: Urology | Admitting: Urology

## 2024-07-28 ENCOUNTER — Encounter: Payer: Self-pay | Admitting: Urology

## 2024-07-28 ENCOUNTER — Ambulatory Visit
Admission: RE | Admit: 2024-07-28 | Discharge: 2024-07-28 | Disposition: A | Discharge status: Home / Self Care | Attending: Urology | Admitting: Urology

## 2024-07-28 VITALS — BP 127/81 | HR 77 | Ht 65.0 in | Wt 200.0 lb

## 2024-07-28 DIAGNOSIS — N952 Postmenopausal atrophic vaginitis: Secondary | ICD-10-CM | POA: Diagnosis not present

## 2024-07-28 DIAGNOSIS — N200 Calculus of kidney: Secondary | ICD-10-CM | POA: Diagnosis present

## 2024-07-28 DIAGNOSIS — N390 Urinary tract infection, site not specified: Secondary | ICD-10-CM

## 2024-07-28 LAB — URINALYSIS, COMPLETE
Bilirubin, UA: NEGATIVE
Glucose, UA: NEGATIVE
Ketones, UA: NEGATIVE
Leukocytes,UA: NEGATIVE
Nitrite, UA: NEGATIVE
Protein,UA: NEGATIVE
RBC, UA: NEGATIVE
Specific Gravity, UA: 1.01 (ref 1.005–1.030)
Urobilinogen, Ur: 1 mg/dL (ref 0.2–1.0)
pH, UA: 6 (ref 5.0–7.5)

## 2024-07-28 LAB — MICROSCOPIC EXAMINATION: RBC, Urine: NONE SEEN /HPF (ref 0–2)

## 2024-07-28 LAB — BLADDER SCAN AMB NON-IMAGING

## 2024-08-06 LAB — CULTURE, URINE COMPREHENSIVE

## 2024-08-07 ENCOUNTER — Ambulatory Visit: Payer: Self-pay | Admitting: Urology

## 2024-08-08 ENCOUNTER — Telehealth: Payer: Self-pay | Admitting: Nurse Practitioner

## 2024-08-08 DIAGNOSIS — Z0000 Encounter for general adult medical examination without abnormal findings: Secondary | ICD-10-CM

## 2024-08-08 NOTE — Telephone Encounter (Signed)
 Lab order needed

## 2024-08-11 NOTE — Telephone Encounter (Signed)
 Labs ordered.

## 2024-08-11 NOTE — Addendum Note (Signed)
 Addended by: VINCENTE SABER on: 08/11/2024 11:07 AM   Modules accepted: Orders

## 2024-08-19 ENCOUNTER — Other Ambulatory Visit

## 2024-08-19 DIAGNOSIS — Z0000 Encounter for general adult medical examination without abnormal findings: Secondary | ICD-10-CM

## 2024-08-19 LAB — LIPID PANEL
Cholesterol: 162 mg/dL (ref 0–200)
HDL: 64.4 mg/dL (ref >39.00)
LDL Cholesterol: 82 mg/dL (ref 0–99)
NonHDL: 97.45
Total CHOL/HDL Ratio: 3
Triglycerides: 79 mg/dL (ref 0.0–149.0)
VLDL: 15.8 mg/dL (ref 0.0–40.0)

## 2024-08-19 LAB — CBC WITH DIFFERENTIAL/PLATELET
Basophils Absolute: 0 K/uL (ref 0.0–0.1)
Basophils Relative: 0.8 % (ref 0.0–3.0)
Eosinophils Absolute: 0.2 K/uL (ref 0.0–0.7)
Eosinophils Relative: 3.6 % (ref 0.0–5.0)
HCT: 39.2 % (ref 36.0–46.0)
Hemoglobin: 13.1 g/dL (ref 12.0–15.0)
Lymphocytes Relative: 16.5 % (ref 12.0–46.0)
Lymphs Abs: 0.9 K/uL (ref 0.7–4.0)
MCHC: 33.5 g/dL (ref 30.0–36.0)
MCV: 89.6 fl (ref 78.0–100.0)
Monocytes Absolute: 0.4 K/uL (ref 0.1–1.0)
Monocytes Relative: 7.9 % (ref 3.0–12.0)
Neutro Abs: 3.9 K/uL (ref 1.4–7.7)
Neutrophils Relative %: 71.2 % (ref 43.0–77.0)
Platelets: 258 K/uL (ref 150.0–400.0)
RBC: 4.37 Mil/uL (ref 3.87–5.11)
RDW: 13.7 % (ref 11.5–15.5)
WBC: 5.4 K/uL (ref 4.0–10.5)

## 2024-08-19 LAB — COMPREHENSIVE METABOLIC PANEL WITH GFR
ALT: 11 U/L (ref 0–35)
AST: 16 U/L (ref 0–37)
Albumin: 4.2 g/dL (ref 3.5–5.2)
Alkaline Phosphatase: 88 U/L (ref 39–117)
BUN: 11 mg/dL (ref 6–23)
CO2: 26 meq/L (ref 19–32)
Calcium: 9 mg/dL (ref 8.4–10.5)
Chloride: 103 meq/L (ref 96–112)
Creatinine, Ser: 0.81 mg/dL (ref 0.40–1.20)
GFR: 80.25 mL/min (ref >60.00)
Glucose, Bld: 98 mg/dL (ref 70–99)
Potassium: 4 meq/L (ref 3.5–5.1)
Sodium: 137 meq/L (ref 135–145)
Total Bilirubin: 0.7 mg/dL (ref 0.2–1.2)
Total Protein: 7.5 g/dL (ref 6.0–8.3)

## 2024-08-19 LAB — VITAMIN D 25 HYDROXY (VIT D DEFICIENCY, FRACTURES): VITD: 44.54 ng/mL (ref 30.00–100.00)

## 2024-08-19 LAB — TSH: TSH: 4.08 u[IU]/mL (ref 0.35–5.50)

## 2024-08-21 ENCOUNTER — Ambulatory Visit: Payer: Self-pay | Admitting: Nurse Practitioner

## 2024-08-23 ENCOUNTER — Encounter: Payer: Self-pay | Admitting: Nurse Practitioner

## 2024-08-23 ENCOUNTER — Ambulatory Visit: Admitting: Nurse Practitioner

## 2024-08-23 VITALS — BP 118/78 | HR 71 | Temp 98.1°F | Ht 65.0 in | Wt 197.0 lb

## 2024-08-23 DIAGNOSIS — Z0000 Encounter for general adult medical examination without abnormal findings: Secondary | ICD-10-CM | POA: Diagnosis not present

## 2024-08-23 DIAGNOSIS — Z23 Encounter for immunization: Secondary | ICD-10-CM

## 2024-08-23 NOTE — Progress Notes (Signed)
 Established Patient Office Visit  Subjective:  Patient ID: Candace Joseph, female    DOB: [birth year]  Age: 58 y.o. MRN: [id number]  CC:  Chief Complaint  Patient presents with   Annual Exam   Discussed the use of AI scribe software for clinical note transcription with the patient, who gave verbal consent to proceed.  History of Present Illness Patient presents to the clinic for annual physical exam. She stopped probiotics for constipation and has been adjusting her diet.  She uses an estrogen cream on Monday, Wednesday, and Friday nights and finds it helpful.   Diet: Patient does eat meat. Patient consumes fruits and veggies.  Has been trying to eat healthy.   Exercise:  Walking daily  Vaccine  Flu: declined Tetanus:2022 Shingles: completed Pneumonia: due Hepatitis B: completed   Colonoscopy: 2018 Cervical cancer screening: Hysterectomy  Mammogram: 2025  Family history:  Colon cancer: No  Breast cancer:No  Dentist: regularly  Ophthalmology:Yearly  Tobacco use:No Alcohol use:No Illicit drugs:No    Past Medical History:  Diagnosis Date   COVID-19    10/23/20   Flu    2022   PONV (postoperative nausea and vomiting)    Right kidney stone    Sinusitis    07/29/21 sinusitis + flu seen tx'ed urgent care tamiflu  and augmentin   Skin lesion 08/13/2018   Vitiligo    arms, legs FH vitiligo   Well adult exam 03/27/2020    Past Surgical History:  Procedure Laterality Date   ABDOMINAL HYSTERECTOMY     for endometriosis no h/o abnormal total ovaries uterus,cervix out    BREAST REDUCTION SURGERY     2001   CESAREAN SECTION     2000   CHOLECYSTECTOMY     EXTRACORPOREAL SHOCK WAVE LITHOTRIPSY Right 10/17/2021   Procedure: EXTRACORPOREAL SHOCK WAVE LITHOTRIPSY (ESWL);  Surgeon: Twylla Glendia BROCKS, MD;  Location: [facility] ORS;  Service: Urology;  Laterality: Right;   REDUCTION MAMMAPLASTY Bilateral 2001    Family History  Problem Relation Age of Onset   Thyroid   disease Mother        hypothyroidism   Heart disease Mother        CHF   Heart disease Father    Hearing loss Father    Stroke Father    Kidney Stones Father    Birth defects Maternal Grandfather    Heart disease Maternal Grandfather    Stroke Maternal Grandfather    Birth defects Paternal Grandmother    Heart disease Paternal Grandfather    Kidney Stones Daughter    Breast cancer Neg Hx     Social History   Socioeconomic History   Marital status: Married    Spouse name: Not on file   Number of children: Not on file   Years of education: Not on file   Highest education level: Master's degree (e.g., MA, MS, MEng, MEd, MSW, MBA)  Occupational History   Not on file  Tobacco Use   Smoking status: Never   Smokeless tobacco: Never  Substance and Sexual Activity   Alcohol use: Not Currently   Drug use: Never   Sexual activity: Yes    Birth control/protection: None  Other Topics Concern   Not on file  Social History Narrative   2 kids son and daughter    Kindergarten teacher    Married    Owns guns, wears seat belt, safe in relationship   Social Drivers of Health   Financial Resource Strain: Low Risk  (08/20/2024)  Overall Financial Resource Strain (CARDIA)    Difficulty of Paying Living Expenses: Not hard at all  Food Insecurity: No Food Insecurity (08/20/2024)   Hunger Vital Sign    Worried About Running Out of Food in the Last Year: Never true    Ran Out of Food in the Last Year: Never true  Transportation Needs: No Transportation Needs (08/20/2024)   PRAPARE - Administrator, Civil Service (Medical): No    Lack of Transportation (Non-Medical): No  Physical Activity: Insufficiently Active (08/20/2024)   Exercise Vital Sign    Days of Exercise per Week: 2 days    Minutes of Exercise per Session: 30 min  Stress: Stress Concern Present (08/20/2024)   [employer] of Occupational Health - Occupational Stress Questionnaire    Feeling of Stress: To  some extent  Social Connections: Moderately Integrated (08/20/2024)   Social Connection and Isolation Panel    Frequency of Communication with Friends and Family: More than three times a week    Frequency of Social Gatherings with Friends and Family: Patient declined    Attends Religious Services: Patient declined    Database Administrator or Organizations: Yes    Attends Banker Meetings: Patient declined    Marital Status: Married  Catering Manager Violence: Not on file     Outpatient Medications Prior to Visit  Medication Sig Dispense Refill   Ascorbic Acid (VITAMIN C) 1000 MG tablet Take 1,000 mg by mouth daily.     Cholecalciferol (VITAMIN D ) 50 MCG (2000 UT) CAPS Take by mouth daily.     estradiol  (ESTRACE ) 0.01 % CREA vaginal cream APPLY 0.5MG  INSIDE THE VAGINAL INTROITUS WITH A FINGER-TIP ON MONDAY, WEDNESDAY AND FRIDAY NIGHTS. 42.5 g 12   ondansetron  (ZOFRAN -ODT) 4 MG disintegrating tablet TAKE 1 TABLET BY MOUTH EVERY 8 HOURS AS NEEDED FOR NAUSEA AND VOMITING 18 tablet 1   Probiotic Product (PROBIOTIC PO) Take by mouth.     No facility-administered medications prior to visit.    No Known Allergies  ROS Review of Systems Negative unless indicated in HPI.    Objective:    Physical Exam Constitutional:      Appearance: Normal appearance. She is normal weight.  HENT:     Head: Normocephalic.     Right Ear: Tympanic membrane normal.     Left Ear: Tympanic membrane normal.     Mouth/Throat:     Mouth: Mucous membranes are moist.  Eyes:     Extraocular Movements: Extraocular movements intact.     Conjunctiva/sclera: Conjunctivae normal.     Pupils: Pupils are equal, round, and reactive to light.  Neck:     Thyroid : No thyroid  mass or thyroid  tenderness.  Cardiovascular:     Rate and Rhythm: Normal rate and regular rhythm.     Pulses: Normal pulses.     Heart sounds: Normal heart sounds. No murmur heard. Pulmonary:     Effort: Pulmonary effort is  normal.     Breath sounds: Normal breath sounds.  Abdominal:     General: Bowel sounds are normal.     Palpations: Abdomen is soft. There is no mass.     Tenderness: There is no abdominal tenderness. There is no rebound.  Musculoskeletal:        General: No swelling.     Cervical back: Neck supple. No tenderness.     Right lower leg: No edema.     Left lower leg: No edema.  Skin:  Findings: No bruising, erythema or rash.  Neurological:     General: No focal deficit present.     Mental Status: She is alert and oriented to person, place, and time. Mental status is at baseline.  Psychiatric:        Mood and Affect: Mood normal.        Behavior: Behavior normal.        Thought Content: Thought content normal.        Judgment: Judgment normal.     BP 118/78   Pulse 71   Temp 98.1 F (36.7 C)   Ht 5' 5 (1.651 m)   Wt 197 lb (89.4 kg)   SpO2 95%   BMI 32.78 kg/m  Wt Readings from Last 3 Encounters:  08/23/24 197 lb (89.4 kg)  07/28/24 200 lb (90.7 kg)  04/22/24 197 lb (89.4 kg)     Health Maintenance  Topic Date Due   HIV Screening  Never done   Cervical Cancer Screening (HPV/Pap Cotest)  Never done   COVID-19 Vaccine (3 - 2025-26 season) 05/30/2024   Influenza Vaccine  12/27/2024 (Originally 04/29/2024)   Mammogram  11/30/2024   Colonoscopy  11/07/2026   DTaP/Tdap/Td (2 - Td or Tdap) 03/29/2031   Pneumococcal Vaccine: 50+ Years  Completed   Hepatitis B Vaccines 19-59 Average Risk  Completed   Hepatitis C Screening  Completed   Zoster Vaccines- Shingrix   Completed   HPV VACCINES  Aged Out   Meningococcal B Vaccine  Aged Out    There are no preventive care reminders to display for this patient.  Lab Results  Component Value Date   TSH 4.08 08/19/2024   Lab Results  Component Value Date   WBC 5.4 08/19/2024   HGB 13.1 08/19/2024   HCT 39.2 08/19/2024   MCV 89.6 08/19/2024   PLT 258.0 08/19/2024   Lab Results  Component Value Date   NA 137 08/19/2024    K 4.0 08/19/2024   CO2 26 08/19/2024   GLUCOSE 98 08/19/2024   BUN 11 08/19/2024   CREATININE 0.81 08/19/2024   BILITOT 0.7 08/19/2024   ALKPHOS 88 08/19/2024   AST 16 08/19/2024   ALT 11 08/19/2024   PROT 7.5 08/19/2024   ALBUMIN 4.2 08/19/2024   CALCIUM 9.0 08/19/2024   ANIONGAP 12 11/20/2023   GFR 80.25 08/19/2024   Lab Results  Component Value Date   CHOL 162 08/19/2024   Lab Results  Component Value Date   HDL 64.40 08/19/2024   Lab Results  Component Value Date   LDLCALC 82 08/19/2024   Lab Results  Component Value Date   TRIG 79.0 08/19/2024   Lab Results  Component Value Date   CHOLHDL 3 08/19/2024   Lab Results  Component Value Date   HGBA1C 5.2 04/08/2023      Assessment & Plan:   Assessment & Plan Immunization due  Orders:   Pneumococcal conjugate vaccine 20-valent  Annual physical exam Physical exam completed. Up to date on mammogram and colonoscopy. Declined flu vaccine.  Received pneumonia vaccine today. -Reviewed recent annual blood work. -Encouraged patient to consume a balanced diet and regular exercise regimen. -Advised to see an ophthalmology and dentist annually.  - Patient declined breast examination.       Assessment & Plan    Follow-up: No follow-ups on file.   Khristian Phillippi, NP

## 2024-08-28 NOTE — Assessment & Plan Note (Addendum)
 Physical exam completed. Up to date on mammogram and colonoscopy. Declined flu vaccine.  Received pneumonia vaccine today. -Reviewed recent annual blood work. -Encouraged patient to consume a balanced diet and regular exercise regimen. -Advised to see an ophthalmology and dentist annually.  - Patient declined breast examination.

## 2024-10-18 ENCOUNTER — Telehealth: Admitting: Physician Assistant

## 2024-10-18 DIAGNOSIS — B9689 Other specified bacterial agents as the cause of diseases classified elsewhere: Secondary | ICD-10-CM | POA: Diagnosis not present

## 2024-10-18 DIAGNOSIS — J208 Acute bronchitis due to other specified organisms: Secondary | ICD-10-CM

## 2024-10-18 MED ORDER — PSEUDOEPH-BROMPHEN-DM 30-2-10 MG/5ML PO SYRP: 5.0000 mL | ORAL_SOLUTION | Freq: Four times a day (QID) | ORAL | 0 refills | Status: AC | PRN

## 2024-10-18 MED ORDER — AZITHROMYCIN 250 MG PO TABS: ORAL_TABLET | ORAL | 0 refills | Status: AC

## 2024-10-18 NOTE — Progress Notes (Signed)
 " Virtual Visit Consent   Candace Joseph, you are scheduled for a virtual visit with a [facility] provider today. Just as with appointments in the office, your consent must be obtained to participate. Your consent will be active for this visit and any virtual visit you may have with one of our providers in the next 365 days. If you have a MyChart account, a copy of this consent can be sent to you electronically.  As this is a virtual visit, video technology does not allow for your provider to perform a traditional examination. This may limit your provider's ability to fully assess your condition. If your provider identifies any concerns that need to be evaluated in person or the need to arrange testing (such as labs, EKG, etc.), we will make arrangements to do so. Although advances in technology are sophisticated, we cannot ensure that it will always work on either your end or our end. If the connection with a video visit is poor, the visit may have to be switched to a telephone visit. With either a video or telephone visit, we are not always able to ensure that we have a secure connection.  By engaging in this virtual visit, you consent to the provision of healthcare and authorize for your insurance to be billed (if applicable) for the services provided during this visit. Depending on your insurance coverage, you may receive a charge related to this service.  I need to obtain your verbal consent now. Are you willing to proceed with your visit today? Candace Joseph has provided verbal consent on 10/18/2024 for a virtual visit (video or telephone). Delon CHRISTELLA Dickinson, PA-C  Date: 10/18/2024 8:56 AM   Virtual Visit via Video Note   I, Delon CHRISTELLA Dickinson, connected with  Candace Joseph  ([id number], [birth year]) on 10/18/24 at  8:45 AM EST by a video-enabled telemedicine application and verified that I am speaking with the correct person using two identifiers.  Location: Patient: Virtual Visit  Location Patient: Home Provider: Virtual Visit Location Provider: Home Office   I discussed the limitations of evaluation and management by telemedicine and the availability of in person appointments. The patient expressed understanding and agreed to proceed.    History of Present Illness: Candace SANTOYO is a 59 y.o. who identifies as a female who was assigned female at birth, and is being seen today for cough and congestion.  HPI: Cough This is a new problem. The current episode started 1 to 4 weeks ago (just over a week and worsened over the last 3 days). The problem has been gradually worsening. The problem occurs every few minutes. The cough is Productive of sputum. Associated symptoms include chest pain (hurts with cough), headaches and shortness of breath (mild, more DOE). Pertinent negatives include no chills, ear congestion, ear pain, fever, hemoptysis, nasal congestion, postnasal drip, rhinorrhea, sore throat, sweats or wheezing. The symptoms are aggravated by lying down and cold air. Treatments tried: Mucinex, tussin cough, hot liquids. The treatment provided no relief. There is no history of asthma or bronchitis.     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  Resting comfortably at home.  Head is normocephalic, atraumatic.  No labored breathing.  Speech is clear and  coherent with logical content.  Patient is alert and oriented at baseline.    Assessment and Plan: 1. Acute bacterial bronchitis (Primary) - azithromycin  (ZITHROMAX ) 250 MG tablet; Take 2 tablets on day 1, then 1 tablet daily on days 2 through 5  Dispense: 6 tablet; Refill: 0 - brompheniramine-pseudoephedrine-DM 30-2-10 MG/5ML syrup; Take 5 mLs by mouth 4 (four) times daily as needed.  Dispense: 120 mL; Refill: 0  - Worsening over a week despite OTC medications - Will treat with Z-pack and Bromfed DM - Can continue Mucinex (PLAIN) during the daytime as needed - Push fluids.  - Rest.  - Steam and humidifier can help - Seek in person evaluation if worsening or symptoms fail to improve    Follow Up Instructions: I discussed the assessment and treatment plan with the patient. The patient was provided an opportunity to ask questions and all were answered. The patient agreed with the plan and demonstrated an understanding of the instructions.  A copy of instructions were sent to the patient via MyChart unless otherwise noted below.    The patient was advised to call back or seek an in-person evaluation if the symptoms worsen or if the condition fails to improve as anticipated.    Delon HERO Alwyn Cordner, PA-C     [1] No Known Allergies [2]  Current Outpatient Medications:    azithromycin  (ZITHROMAX ) 250 MG tablet, Take 2 tablets on day 1, then 1 tablet daily on days 2 through 5, Disp: 6 tablet, Rfl: 0   brompheniramine-pseudoephedrine-DM 30-2-10 MG/5ML syrup, Take 5 mLs by mouth 4 (four) times daily as needed., Disp: 120 mL, Rfl: 0   Ascorbic Acid (VITAMIN C) 1000 MG tablet, Take 1,000 mg by mouth daily., Disp: , Rfl:    Cholecalciferol (VITAMIN D ) 50 MCG (2000 UT) CAPS, Take by mouth daily., Disp: , Rfl:    estradiol  (ESTRACE ) 0.01 % CREA vaginal cream, APPLY 0.5MG  INSIDE THE VAGINAL INTROITUS WITH A FINGER-TIP ON MONDAY, WEDNESDAY AND FRIDAY NIGHTS., Disp: 42.5 g, Rfl: 12   ondansetron   (ZOFRAN -ODT) 4 MG disintegrating tablet, TAKE 1 TABLET BY MOUTH EVERY 8 HOURS AS NEEDED FOR NAUSEA AND VOMITING, Disp: 18 tablet, Rfl: 1  "

## 2024-10-18 NOTE — Patient Instructions (Signed)
 " Candace Joseph, thank you for joining Candace CHRISTELLA Dickinson, PA-C for today's virtual visit.  While this provider is not your primary care provider (PCP), if your PCP is located in our provider database this encounter information will be shared with them immediately following your visit.   A [facility] MyChart account gives you access to today's visit and all your visits, tests, and labs performed at [facility]  click here if you don't have a [facility] MyChart account or go to [email or web address]  Consent: (Patient) Candace Joseph provided verbal consent for this virtual visit at the beginning of the encounter.  Current Medications:  Current Outpatient Medications:    azithromycin  (ZITHROMAX ) 250 MG tablet, Take 2 tablets on day 1, then 1 tablet daily on days 2 through 5, Disp: 6 tablet, Rfl: 0   brompheniramine-pseudoephedrine-DM 30-2-10 MG/5ML syrup, Take 5 mLs by mouth 4 (four) times daily as needed., Disp: 120 mL, Rfl: 0   Ascorbic Acid (VITAMIN C) 1000 MG tablet, Take 1,000 mg by mouth daily., Disp: , Rfl:    Cholecalciferol (VITAMIN D ) 50 MCG (2000 UT) CAPS, Take by mouth daily., Disp: , Rfl:    estradiol  (ESTRACE ) 0.01 % CREA vaginal cream, APPLY 0.5MG  INSIDE THE VAGINAL INTROITUS WITH A FINGER-TIP ON MONDAY, WEDNESDAY AND FRIDAY NIGHTS., Disp: 42.5 g, Rfl: 12   ondansetron  (ZOFRAN -ODT) 4 MG disintegrating tablet, TAKE 1 TABLET BY MOUTH EVERY 8 HOURS AS NEEDED FOR NAUSEA AND VOMITING, Disp: 18 tablet, Rfl: 1   Medications ordered in this encounter:  Meds ordered this encounter  Medications   azithromycin  (ZITHROMAX ) 250 MG tablet    Sig: Take 2 tablets on day 1, then 1 tablet daily on days 2 through 5    Dispense:  6 tablet    Refill:  0    Supervising Provider:   LAMPTEY, PHILIP O [[id number]]   brompheniramine-pseudoephedrine-DM 30-2-10 MG/5ML syrup    Sig: Take 5 mLs by mouth 4 (four) times daily as needed.    Dispense:  120 mL    Refill:  0    Supervising  Provider:   BLAISE ALEENE KIDD [[id number]]     *If you need refills on other medications prior to your next appointment, please contact your pharmacy*  Follow-Up: Call back or seek an in-person evaluation if the symptoms worsen or if the condition fails to improve as anticipated.  [facility] [phone]  Other Instructions Acute Bronchitis, Adult  Acute bronchitis is sudden inflammation of the main airways (bronchi) that come off the windpipe (trachea) in the lungs. The swelling causes the airways to get smaller and make more mucus than normal. This can make it hard to breathe and can cause coughing or noisy breathing (wheezing). Acute bronchitis may last several weeks. The cough may last longer. Allergies, asthma, and exposure to smoke may make the condition worse. What are the causes? This condition can be caused by germs and by substances that irritate the lungs, including: Cold and flu viruses. The most common cause of this condition is the virus that causes the common cold. Bacteria. This is less common. Breathing in substances that irritate the lungs, including: Smoke from cigarettes and other forms of tobacco. Dust and pollen. Fumes from household cleaning products, gases, or burned fuel. Indoor or outdoor air pollution. What increases the risk? The following factors may make you more likely to develop this condition: A weak body's defense system, also called the immune system. A condition that affects your  lungs and breathing, such as asthma. What are the signs or symptoms? Common symptoms of this condition include: Coughing. This may bring up clear, yellow, or green mucus from your lungs (sputum). Wheezing. Runny or stuffy nose. Having too much mucus in your lungs (chest congestion). Shortness of breath. Aches and pains, including sore throat or chest. How is this diagnosed? This condition is usually diagnosed based on: Your symptoms and medical  history. A physical exam. You may also have other tests, including tests to rule out other conditions, such as pneumonia. These tests include: A test of lung function. Test of a mucus sample to look for the presence of bacteria. Tests to check the oxygen level in your blood. Blood tests. Chest X-ray. How is this treated? Most cases of acute bronchitis clear up over time without treatment. Your health care provider may recommend: Drinking more fluids to help thin your mucus so it is easier to cough up. Taking inhaled medicine (inhaler) to improve air flow in and out of your lungs. Using a vaporizer or a humidifier. These are machines that add water to the air to help you breathe better. Taking a medicine that thins mucus and clears congestion (expectorant). Taking a medicine that prevents or stops coughing (cough suppressant). It is not common to take an antibiotic medicine for this condition. Follow these instructions at home:  Take over-the-counter and prescription medicines only as told by your health care provider. Use an inhaler, vaporizer, or humidifier as told by your health care provider. Take two teaspoons (10 mL) of honey at bedtime to lessen coughing at night. Drink enough fluid to keep your urine pale yellow. Do not use any products that contain nicotine or tobacco. These products include cigarettes, chewing tobacco, and vaping devices, such as e-cigarettes. If you need help quitting, ask your health care provider. Get plenty of rest. Return to your normal activities as told by your health care provider. Ask your health care provider what activities are safe for you. Keep all follow-up visits. This is important. How is this prevented? To lower your risk of getting this condition again: Wash your hands often with soap and water for at least 20 seconds. If soap and water are not available, use hand sanitizer. Avoid contact with people who have cold symptoms. Try not to touch  your mouth, nose, or eyes with your hands. Avoid breathing in smoke or chemical fumes. Breathing smoke or chemical fumes will make your condition worse. Get the flu shot every year. Contact a health care provider if: Your symptoms do not improve after 2 weeks. You have trouble coughing up the mucus. Your cough keeps you awake at night. You have a fever. Get help right away if you: Cough up blood. Feel pain in your chest. Have severe shortness of breath. Faint or keep feeling like you are going to faint. Have a severe headache. Have a fever or chills that get worse. These symptoms may represent a serious problem that is an emergency. Do not wait to see if the symptoms will go away. Get medical help right away. Call your local emergency services (911 in the [place] Do not drive yourself to the hospital. Summary Acute bronchitis is inflammation of the main airways (bronchi) that come off the windpipe (trachea) in the lungs. The swelling causes the airways to get smaller and make more mucus than normal. Drinking more fluids can help thin your mucus so it is easier to cough up. Take over-the-counter and prescription medicines only as  told by your health care provider. Do not use any products that contain nicotine or tobacco. These products include cigarettes, chewing tobacco, and vaping devices, such as e-cigarettes. If you need help quitting, ask your health care provider. Contact a health care provider if your symptoms do not improve after 2 weeks. This information is not intended to replace advice given to you by your health care provider. Make sure you discuss any questions you have with your health care provider. Document Revised: 12/26/2021 Document Reviewed: 01/16/2021 Elsevier Patient Education  2024 [employer]   If you have been instructed to have an in-person evaluation today at a local Urgent Care facility, please use the link below. It will take you to a list of all of our  available [facility], including address, phone number and hours of operation. Please do not delay care.  [facility]  If you or a family member do not have a primary care provider, use the link below to schedule a visit and establish care. When you choose a [facility] primary care physician or advanced practice provider, you gain a long-term partner in health. Find a Primary Care Provider  Learn more about [facility]'s in-office and virtual care options: [facility] Get Care Now "

## 2025-08-29 ENCOUNTER — Encounter: Admitting: Nurse Practitioner
